# Patient Record
Sex: Female | Born: 1970 | Hispanic: Yes | Marital: Single | Smoking: Never smoker
Health system: Southern US, Community
[De-identification: ages and names within clinical notes are randomized; demographics above are authoritative.]

## PROBLEM LIST (undated history)

## (undated) DIAGNOSIS — I10 Essential (primary) hypertension: Secondary | ICD-10-CM

---

## 2006-05-06 ENCOUNTER — Emergency Department: Payer: Self-pay | Admitting: Emergency Medicine

## 2006-05-20 ENCOUNTER — Emergency Department: Payer: Self-pay | Admitting: Emergency Medicine

## 2006-05-21 ENCOUNTER — Ambulatory Visit: Payer: Self-pay | Admitting: Emergency Medicine

## 2006-09-17 ENCOUNTER — Ambulatory Visit: Payer: Self-pay

## 2006-10-24 ENCOUNTER — Ambulatory Visit: Payer: Self-pay

## 2006-11-11 ENCOUNTER — Inpatient Hospital Stay: Payer: Self-pay | Admitting: Unknown Physician Specialty

## 2006-11-15 ENCOUNTER — Ambulatory Visit: Payer: Self-pay

## 2007-06-12 ENCOUNTER — Inpatient Hospital Stay: Payer: Self-pay | Admitting: General Surgery

## 2008-05-31 ENCOUNTER — Emergency Department: Payer: Self-pay | Admitting: Emergency Medicine

## 2009-06-25 ENCOUNTER — Ambulatory Visit: Payer: Self-pay | Admitting: Family Medicine

## 2009-10-05 ENCOUNTER — Inpatient Hospital Stay: Payer: Self-pay | Admitting: Internal Medicine

## 2012-05-19 ENCOUNTER — Emergency Department: Payer: Self-pay | Admitting: Emergency Medicine

## 2012-07-03 ENCOUNTER — Emergency Department: Payer: Self-pay | Admitting: Emergency Medicine

## 2012-07-03 LAB — URINALYSIS, COMPLETE
Bilirubin,UR: NEGATIVE
Glucose,UR: NEGATIVE mg/dL (ref 0–75)
Leukocyte Esterase: NEGATIVE
Ph: 5 (ref 4.5–8.0)
RBC,UR: 3 /HPF (ref 0–5)
Squamous Epithelial: 1

## 2012-07-03 LAB — CBC
HCT: 40.9 % (ref 35.0–47.0)
MCH: 30.6 pg (ref 26.0–34.0)
Platelet: 286 10*3/uL (ref 150–440)
RBC: 4.43 10*6/uL (ref 3.80–5.20)
RDW: 13.3 % (ref 11.5–14.5)
WBC: 11.5 10*3/uL — ABNORMAL HIGH (ref 3.6–11.0)

## 2012-07-03 LAB — COMPREHENSIVE METABOLIC PANEL
Albumin: 4.3 g/dL (ref 3.4–5.0)
Alkaline Phosphatase: 89 U/L (ref 50–136)
Bilirubin,Total: 0.8 mg/dL (ref 0.2–1.0)
Calcium, Total: 8.6 mg/dL (ref 8.5–10.1)
Chloride: 110 mmol/L — ABNORMAL HIGH (ref 98–107)
Creatinine: 0.42 mg/dL — ABNORMAL LOW (ref 0.60–1.30)
Glucose: 101 mg/dL — ABNORMAL HIGH (ref 65–99)
Potassium: 3.3 mmol/L — ABNORMAL LOW (ref 3.5–5.1)
SGOT(AST): 26 U/L (ref 15–37)
SGPT (ALT): 27 U/L (ref 12–78)
Sodium: 140 mmol/L (ref 136–145)

## 2012-07-03 LAB — LIPASE, BLOOD: Lipase: 74 U/L (ref 73–393)

## 2012-07-03 LAB — PREGNANCY, URINE: Pregnancy Test, Urine: NEGATIVE m[IU]/mL

## 2013-03-06 ENCOUNTER — Inpatient Hospital Stay: Payer: Self-pay | Admitting: Surgery

## 2013-03-06 LAB — COMPREHENSIVE METABOLIC PANEL
Albumin: 3.9 g/dL (ref 3.4–5.0)
Anion Gap: 5 — ABNORMAL LOW (ref 7–16)
BUN: 10 mg/dL (ref 7–18)
Bilirubin,Total: 0.4 mg/dL (ref 0.2–1.0)
Calcium, Total: 9.3 mg/dL (ref 8.5–10.1)
Chloride: 102 mmol/L (ref 98–107)
Co2: 28 mmol/L (ref 21–32)
Creatinine: 0.59 mg/dL — ABNORMAL LOW (ref 0.60–1.30)
Osmolality: 271 (ref 275–301)
SGPT (ALT): 37 U/L (ref 12–78)
Sodium: 135 mmol/L — ABNORMAL LOW (ref 136–145)

## 2013-03-06 LAB — URINALYSIS, COMPLETE
Bilirubin,UR: NEGATIVE
Glucose,UR: NEGATIVE mg/dL (ref 0–75)
Ph: 6 (ref 4.5–8.0)
Protein: NEGATIVE
RBC,UR: 2 /HPF (ref 0–5)
Specific Gravity: 1.004 (ref 1.003–1.030)

## 2013-03-06 LAB — LIPASE, BLOOD: Lipase: 74 U/L (ref 73–393)

## 2013-03-06 LAB — CBC
HCT: 40.5 % (ref 35.0–47.0)
HGB: 14.2 g/dL (ref 12.0–16.0)
MCV: 91 fL (ref 80–100)
RBC: 4.44 10*6/uL (ref 3.80–5.20)

## 2013-03-07 LAB — CBC WITH DIFFERENTIAL/PLATELET
Basophil #: 0.1 10*3/uL (ref 0.0–0.1)
Eosinophil %: 8.4 %
HGB: 13.3 g/dL (ref 12.0–16.0)
Lymphocyte %: 33.5 %
MCH: 31.6 pg (ref 26.0–34.0)
MCHC: 34.3 g/dL (ref 32.0–36.0)
MCV: 92 fL (ref 80–100)
Monocyte %: 11.2 %
Neutrophil #: 4 10*3/uL (ref 1.4–6.5)
Neutrophil %: 46 %
Platelet: 289 10*3/uL (ref 150–440)
WBC: 8.7 10*3/uL (ref 3.6–11.0)

## 2013-03-07 LAB — COMPREHENSIVE METABOLIC PANEL
Alkaline Phosphatase: 105 U/L (ref 50–136)
Anion Gap: 2 — ABNORMAL LOW (ref 7–16)
Calcium, Total: 9.1 mg/dL (ref 8.5–10.1)
Co2: 31 mmol/L (ref 21–32)
Creatinine: 0.54 mg/dL — ABNORMAL LOW (ref 0.60–1.30)
EGFR (African American): >60
EGFR (Non-African Amer.): >60
Glucose: 107 mg/dL — ABNORMAL HIGH (ref 65–99)
Osmolality: 275 (ref 275–301)
Potassium: 3.9 mmol/L (ref 3.5–5.1)
SGOT(AST): 20 U/L (ref 15–37)
SGPT (ALT): 29 U/L (ref 12–78)
Sodium: 138 mmol/L (ref 136–145)
Total Protein: 8.3 g/dL — ABNORMAL HIGH (ref 6.4–8.2)

## 2013-03-08 LAB — COMPREHENSIVE METABOLIC PANEL
Albumin: 3 g/dL — ABNORMAL LOW (ref 3.4–5.0)
Alkaline Phosphatase: 202 U/L — ABNORMAL HIGH (ref 50–136)
Bilirubin,Total: 0.4 mg/dL (ref 0.2–1.0)
Calcium, Total: 8.8 mg/dL (ref 8.5–10.1)
Chloride: 105 mmol/L (ref 98–107)
Creatinine: 0.43 mg/dL — ABNORMAL LOW (ref 0.60–1.30)
EGFR (Non-African Amer.): >60
Glucose: 118 mg/dL — ABNORMAL HIGH (ref 65–99)
Potassium: 3.8 mmol/L (ref 3.5–5.1)
SGOT(AST): 64 U/L — ABNORMAL HIGH (ref 15–37)
SGPT (ALT): 64 U/L (ref 12–78)

## 2013-03-08 LAB — CBC WITH DIFFERENTIAL/PLATELET
Basophil #: 0 10*3/uL (ref 0.0–0.1)
Basophil %: 0.1 %
Eosinophil #: 0 10*3/uL (ref 0.0–0.7)
HCT: 34.8 % — ABNORMAL LOW (ref 35.0–47.0)
MCH: 31.6 pg (ref 26.0–34.0)
MCV: 92 fL (ref 80–100)
Neutrophil #: 10.5 10*3/uL — ABNORMAL HIGH (ref 1.4–6.5)
Platelet: 290 10*3/uL (ref 150–440)

## 2013-03-11 LAB — PATHOLOGY REPORT

## 2013-06-30 ENCOUNTER — Ambulatory Visit: Payer: Self-pay

## 2014-04-13 ENCOUNTER — Emergency Department: Payer: Self-pay | Admitting: Emergency Medicine

## 2014-04-13 LAB — URINALYSIS, COMPLETE
BILIRUBIN, UR: NEGATIVE
Bacteria: NONE SEEN
Glucose,UR: NEGATIVE mg/dL (ref 0–75)
KETONE: NEGATIVE
Nitrite: NEGATIVE
Ph: 6 (ref 4.5–8.0)
Protein: NEGATIVE
RBC,UR: 3 /HPF (ref 0–5)
Specific Gravity: 1.011 (ref 1.003–1.030)
Squamous Epithelial: 3
WBC UR: 33 /HPF (ref 0–5)

## 2014-08-10 ENCOUNTER — Ambulatory Visit: Payer: Self-pay

## 2014-08-14 ENCOUNTER — Ambulatory Visit: Payer: Self-pay

## 2014-09-10 ENCOUNTER — Emergency Department: Payer: Self-pay | Admitting: Emergency Medicine

## 2014-09-28 ENCOUNTER — Emergency Department: Payer: Self-pay | Admitting: Emergency Medicine

## 2014-11-06 NOTE — H&P (Signed)
PATIENT NAME:  Terry ChenLAGUNES HERNANDEZ, Asusena MR#:  [id number] DATE OF BIRTH:  1971-04-01  SURGICAL HISTORY AND PHYSICAL  CHIEF COMPLAINT: Right upper quadrant pain.   HISTORY OF PRESENT ILLNESS: This is a patient with right upper quadrant pain. Unclear if she has had episodes before, but clearly from reviewing the chart, she has had visits to the Emergency Room with work-up in the past not showing gallstones. Currently, she does show gallstones on ultrasound and I was asked to see the patient for probable acute cholecystitis.   The patient describes nausea, vomiting, three days of abdominal pain centered in the epigastrium and right upper quadrant radiating to her right shoulder. She may have even had some fevers. I am not exactly sure. Denies jaundice and has had normal bowel movements. No blood.   PAST MEDICAL HISTORY: Hypertension.   PAST SURGICAL HISTORY: C-section.   ALLERGIES: None.   MEDICATIONS: Amlodipine. Others noted in chart.   FAMILY HISTORY: Noncontributory.   SOCIAL HISTORY: Does not smoke or drink.   REVIEW OF SYSTEMS: A 10-system review is performed and negative with the exception of that mentioned in the HPI.   PHYSICAL EXAMINATION:  GENERAL: Healthy, uncomfortable-appearing Hispanic female patient.  VITAL SIGNS: Temperature 99, pulse 81, respirations 20, blood pressure 119/79, pain scale 8, 98% room air saturation.  HEENT: No scleral icterus.  NECK: No palpable neck nodes.  CHEST: Clear to auscultation.  CARDIAC: Regular rate and rhythm.  ABDOMEN: Soft. Tenderness in the right upper quadrant. A midline scar is present infraumbilically without hernia. Tender in the right upper quadrant with a positive Murphy sign.  EXTREMITIES: Without edema. Calves are nontender.  NEUROLOGIC: Grossly intact.  INTEGUMENT: No jaundice.   DIAGNOSTIC STUDIES:  Ultrasound shows possible small stones or sludge.   Blood 2+ is noted and leukocyte esterase is positive on UA.   Sodium is  135. Potassium 3.4, creatinine of 0.59.   White blood cell count of 13.6 and H and H of 14 and 40.   ASSESSMENT AND PLAN: This is a patient with likely acute cholecystitis. She has had multiple episodes like this, so it is documented in the chart. I will obtain a consent via an interpreter once the interpreter becomes available, but my plan would be to admit the patient to the hospital, start IV antibiotics, and hydrate her and plan laparoscopic cholecystectomy once she is hydrated. The rationale for this was discussed with her. She voiced understanding, but I will obtain a proper interpreter evaluation later.    ____________________________ Adah Salvageichard E. Excell Seltzerooper, MD rec:np D: 03/06/2013 19:43:54 ET T: 03/06/2013 21:10:17 ET JOB#: [id number]  cc: Adah Salvageichard E. Excell Seltzerooper, MD, <Dictator> Lattie HawICHARD E COOPER MD ELECTRONICALLY SIGNED 03/07/2013 7:44

## 2014-11-06 NOTE — Discharge Summary (Signed)
PATIENT NAME:  Terry [name], Terry [name] MR#:  [id number] DATE OF BIRTH:  1970/09/27  DATE OF ADMISSION:  03/06/2013 DATE OF DISCHARGE:  03/09/2013  DIAGNOSES:  Hypertension, acute cholecystitis.   PROCEDURES: Laparoscopic cholecystectomy.   HISTORY OF PRESENT ILLNESS AND HOSPITAL COURSE: This is a patient with a diagnosis of acute cholecystitis seen in the Emergency Room. She was taken to the Operating Room where acute cholecystitis was confirmed. She made an uncomplicated postoperative recovery, was tolerating a regular diet and discharged in stable condition to follow up in our office in 10 days. She is given and Percocet for pain and asked to follow up in our office in 10 days and to restart all of her regular medications for hypertension. See medical reconciliation sheet. She is given wound care instructions about showering and will see us next week.   ____________________________ Adah Salvageichard E. Excell Seltzerooper, MD rec:cs D: 03/09/2013 10:48:30 ET T: 03/09/2013 18:48:47 ET JOB#: [id number]  cc: Adah Salvageichard E. Excell Seltzerooper, MD, <Dictator> Lattie HawICHARD E Shirlie Enck MD ELECTRONICALLY SIGNED 03/13/2013 9:34

## 2014-11-06 NOTE — Op Note (Signed)
PATIENT NAME:  Terry [name], Terry [name] MR#:  [id number] DATE OF BIRTH:  October 18, 1970  DATE OF PROCEDURE:  03/07/2013  PREOPERATIVE DIAGNOSIS: Acute cholecystitis.   POSTOPERATIVE DIAGNOSIS: Acute cholecystitis.   PROCEDURE: Laparoscopic cholecystectomy.   SURGEON: Dionne Miloichard Nessie Nong, MD   ANESTHESIA: General with endotracheal tube.   INDICATIONS: This is a patient with unrelenting right upper quadrant pain associated with fatty food intolerance and work-up showing probable acute cholecystitis with positive Murphy's sign and ultrasound showing stones and sludge. Preoperatively, we discussed rationale for surgery, the options of observation, risk of bleeding, infection, recurrence of symptoms, failure to resolve her symptoms, open procedure, bile duct damage, bile duct leak, bowel injury and retained stone any of which could require further surgery and/or ERCP, stent and papillotomy. This was reviewed for her with the aid of an interpreter this morning. She understood and agreed to proceed.   FINDINGS: Somewhat edematous gallbladder with scar tissue on the anterior surface of the gallbladder wall. Thick bile.   DESCRIPTION OF PROCEDURE: The patient was induced to general anesthesia, given IV antibiotics. VTE prophylaxis was in place. She was prepped and draped in a sterile fashion. Marcaine was infiltrated in skin and subcutaneous tissues around the supraumbilical area. Incision was made. Veress needle was placed, pneumoperitoneum was obtained and a 5 mm trocar port was placed. The abdominal cavity was explored and under direct vision a 10 mm epigastric port and 2 lateral 5 mm ports were placed. The gallbladder was placed on tension. The peritoneum over the infundibulum was incised bluntly. The cystic duct gallbladder junction was well identified. The cystic artery was doubly clipped and divided in 2 branches. This allowed for good visualization of the cystic duct as it entered the infundibulum. Here it  was doubly clipped and divided, and the gallbladder was taken from the gallbladder fossa with electrocautery and passed out through the epigastric port site with the aid of an Endo Catch bag. The area was checked for hemostasis and was found to be adequate. There was no sign of bleeding or bile leak. The camera was placed in the epigastric site to view back at the periumbilical site. There were adhesions in the pelvis, but no sign of bowel injury. The trocar was well away from these adhesions. Again, no sign of bleeding, bile leak or bowel injury. Therefore, pneumoperitoneum was released. All ports were removed. Fascial edges at the epigastric site were approximated with 0 Vicryl figure-of-eight sutures. 4-0 subcuticular Monocryl was used at all skin edges. Steri-Strips, Mastisol and sterile dressings were placed.   The patient tolerated the procedure well. There were no complications. She was taken to the recovery room in stable condition to be admitted for continued care. ____________________________ Adah Salvageichard E. Excell Seltzerooper, MD rec:sb D: 03/07/2013 11:40:07 ET T: 03/07/2013 13:11:09 ET JOB#: [id number]  cc: Adah Salvageichard E. Excell Seltzerooper, MD, <Dictator> Lattie HawICHARD E Glynnis Gavel MD ELECTRONICALLY SIGNED 03/07/2013 13:53

## 2014-11-06 NOTE — H&P (Signed)
Subjective/Chief Complaint RUQ pain   History of Present Illness 3 days RUQ pain, nausea, vomiting fevers? no prior eopisode no jaundice nml BM   Past History PMH HTN PSH c section   Past Medical Health Hypertension   Past Med/Surgical Hx:  Migraines:   C-section:   ALLERGIES:  No Known Allergies:   Family and Social History:  Family History Non-Contributory   Social History negative tobacco, negative ETOH   Place of Living Home   Review of Systems:  Fever/Chills No   Cough No   Abdominal Pain Yes   Diarrhea No   Constipation No   Nausea/Vomiting Yes   SOB/DOE No   Chest Pain No   Dysuria No   Tolerating Diet No  Nauseated  Vomiting   Medications/Allergies Reviewed Medications/Allergies reviewed   Physical Exam:  GEN uncomfortable   HEENT pink conjunctivae   NECK supple   RESP normal resp effort  clear BS   CARD regular rate   ABD positive tenderness  soft  pos Murphy's sign   LYMPH negative neck   EXTR negative edema   SKIN normal to palpation   PSYCH alert, A+O to time, place, person   Lab Results: Hepatic:  21-Aug-14 14:32   Bilirubin, Total 0.4  Alkaline Phosphatase 130  SGPT (ALT) 37  SGOT (AST) 25  Total Protein, Serum  9.2  Albumin, Serum 3.9  Routine Chem:  21-Aug-14 14:32   Glucose, Serum  123  BUN 10  Creatinine (comp)  0.59  Sodium, Serum  135  Potassium, Serum  3.4  Chloride, Serum 102  CO2, Serum 28  Calcium (Total), Serum 9.3  Osmolality (calc) 271  eGFR (African American) >60  eGFR (Non-African American) >60 (eGFR values <14m/min/1.73 m2 may be an indication of chronic kidney disease (CKD). Calculated eGFR is useful in patients with stable renal function. The eGFR calculation will not be reliable in acutely ill patients when serum creatinine is changing rapidly. It is not useful in  patients on dialysis. The eGFR calculation may not be applicable to patients at the low and high extremes of body sizes,  pregnant women, and vegetarians.)  Anion Gap  5  Lipase 74 (Result(s) reported on 06 Mar 2013 at 03:06PM.)  Routine UA:  21-Aug-14 14:32   Color (UA) Straw  Clarity (UA) Clear  Glucose (UA) Negative  Bilirubin (UA) Negative  Ketones (UA) Negative  Specific Gravity (UA) 1.004  Blood (UA) 2+  pH (UA) 6.0  Protein (UA) Negative  Nitrite (UA) Negative  Leukocyte Esterase (UA) 1+ (Result(s) reported on 06 Mar 2013 at 03:02PM.)  RBC (UA) 2 /HPF  WBC (UA) 8 /HPF  Bacteria (UA) TRACE  Epithelial Cells (UA) 1 /HPF (Result(s) reported on 06 Mar 2013 at 03:02PM.)  Routine Hem:  21-Aug-14 14:32   WBC (CBC)  13.6  RBC (CBC) 4.44  Hemoglobin (CBC) 14.2  Hematocrit (CBC) 40.5  Platelet Count (CBC) 302 (Result(s) reported on 06 Mar 2013 at 02:58PM.)  MCV 91  MCH 31.9  MCHC 35.0  RDW 13.8   Radiology Results: [place]    21-Aug-14 16:56, [place]  [place]  REASON FOR EXAM:    RUQ pain, n/v and right shoulder pain  COMMENTS:   Body Site: GB and Fossa, CBD, Head of Pancreas    PROCEDURE: [place] - [place] SURVE[place]  - Mar 06 2013  4:56PM     RESULT: Limited right upper quadrant abdominal ultrasound is performed.  The patient has a previous abdominal ultrasound dated 07/03/2012.    The pancreas is not visualized because of overlying bowel gas. The liver   size is 13.95 cm. The hepatic echotexture appears to be grossly normal.   No intrahepatic biliary ductal dilation is evident. The portal venous   flow is normal. The gallbladder wall thickness is 1.3 mm. A negative   sonographic Murphy's sign is present. There are several small echogenic   nonshadowing mobile structures within the gallbladder which could   represent very tiny stones. Sludge is a consideration.  IMPRESSION:  Possible sludge or tiny stones. No evidence of acute   cholecystitis. Nonvisualization of the pancreas.    Dictation Site: 2        Verified By: Sundra Aland, M.D.,  MD    Assessment/Admission Diagnosis acute chole cystitis rec admit hydrate lap chole later will get interpretor for consent   Electronic Signatures: Florene Glen (MD)  (Signed 21-Aug-14 19:40)  Authored: CHIEF COMPLAINT and HISTORY, PAST MEDICAL/SURGIAL HISTORY, ALLERGIES, FAMILY AND SOCIAL HISTORY, REVIEW OF SYSTEMS, PHYSICAL EXAM, LABS, Radiology, ASSESSMENT AND PLAN   Last Updated: 21-Aug-14 19:40 by Florene Glen (MD)

## 2015-01-30 ENCOUNTER — Encounter: Payer: Self-pay | Admitting: Emergency Medicine

## 2015-01-30 ENCOUNTER — Emergency Department
Admission: EM | Admit: 2015-01-30 | Discharge: 2015-01-30 | Disposition: A | Discharge status: Home / Self Care | Payer: Self-pay | Attending: Emergency Medicine | Admitting: Emergency Medicine

## 2015-01-30 ENCOUNTER — Other Ambulatory Visit: Payer: Self-pay

## 2015-01-30 ENCOUNTER — Emergency Department: Payer: Self-pay

## 2015-01-30 DIAGNOSIS — R079 Chest pain, unspecified: Secondary | ICD-10-CM | POA: Insufficient documentation

## 2015-01-30 DIAGNOSIS — E876 Hypokalemia: Secondary | ICD-10-CM | POA: Insufficient documentation

## 2015-01-30 DIAGNOSIS — I10 Essential (primary) hypertension: Secondary | ICD-10-CM | POA: Insufficient documentation

## 2015-01-30 HISTORY — DX: Essential (primary) hypertension: I10

## 2015-01-30 LAB — COMPREHENSIVE METABOLIC PANEL
ALBUMIN: 4.2 g/dL (ref 3.5–5.0)
ALT: 15 U/L (ref 14–54)
ANION GAP: 9 (ref 5–15)
AST: 29 U/L (ref 15–41)
Alkaline Phosphatase: 67 U/L (ref 38–126)
BUN: 10 mg/dL (ref 6–20)
CO2: 22 mmol/L (ref 22–32)
Calcium: 8.8 mg/dL — ABNORMAL LOW (ref 8.9–10.3)
Chloride: 106 mmol/L (ref 101–111)
Creatinine, Ser: 0.7 mg/dL (ref 0.44–1.00)
GFR calc Af Amer: >60 mL/min (ref >60)
GFR calc non Af Amer: >60 mL/min (ref >60)
GLUCOSE: 180 mg/dL — AB (ref 65–99)
POTASSIUM: 2.8 mmol/L — AB (ref 3.5–5.1)
SODIUM: 137 mmol/L (ref 135–145)
TOTAL PROTEIN: 7.7 g/dL (ref 6.5–8.1)
Total Bilirubin: 0.7 mg/dL (ref 0.3–1.2)

## 2015-01-30 LAB — CBC
HEMATOCRIT: 35.3 % (ref 35.0–47.0)
HEMOGLOBIN: 11.6 g/dL — AB (ref 12.0–16.0)
MCH: 27.2 pg (ref 26.0–34.0)
MCHC: 32.9 g/dL (ref 32.0–36.0)
MCV: 82.6 fL (ref 80.0–100.0)
Platelets: 273 10*3/uL (ref 150–440)
RBC: 4.27 MIL/uL (ref 3.80–5.20)
RDW: 18.6 % — AB (ref 11.5–14.5)
WBC: 7.6 10*3/uL (ref 3.6–11.0)

## 2015-01-30 LAB — TROPONIN I: Troponin I: 0.03 ng/mL (ref <0.031)

## 2015-01-30 MED ORDER — POTASSIUM CHLORIDE 20 MEQ PO PACK
40.0000 meq | PACK | Freq: Once | ORAL | Status: AC
  Administered 2015-01-30: 40 meq via ORAL
  Filled 2015-01-30: qty 2

## 2015-01-30 MED ORDER — ACETAMINOPHEN 325 MG PO TABS
ORAL_TABLET | ORAL | Status: AC
  Administered 2015-01-30: 650 mg via ORAL
  Filled 2015-01-30: qty 2

## 2015-01-30 MED ORDER — ACETAMINOPHEN 325 MG PO TABS
650.0000 mg | ORAL_TABLET | Freq: Once | ORAL | Status: AC
  Administered 2015-01-30: 650 mg via ORAL

## 2015-01-30 NOTE — ED Provider Notes (Signed)
[facility] Emergency Department Provider Note  ____________________________________________  Time seen: 3:00 a.m.  I have reviewed the triage vital signs and the nursing notes.   HISTORY  Chief Complaint Chest Pain      HPI Terry [name] is a 44 y.o. female presents with central chest chest pain 5 hours. Patient denies any dyspnea no nausea no vomiting. Patient stated that both of her hands and around her mouth became numb while at work tonight. Patient admits to this breathing heavily and fast at the time of numbness secondary to her chest pain.She denies any leg pain or swelling     Past Medical History  Diagnosis Date  . Hypertension     There are no active problems to display for this patient.   No past surgical history on file.  No current outpatient prescriptions on file.  Allergies Review of patient's allergies indicates no known allergies.  No family history on file.  Social History History  Substance Use Topics  . Smoking status: Never Smoker   . Smokeless tobacco: Not on file  . Alcohol Use: Not on file    Review of Systems  Constitutional: Negative for fever. Eyes: Negative for visual changes. ENT: Negative for sore throat. Cardiovascular: Positive for chest pain. Respiratory: Negative for shortness of breath. Gastrointestinal: Negative for abdominal pain, vomiting and diarrhea. Genitourinary: Negative for dysuria. Musculoskeletal: Negative for back pain. Skin: Negative for rash. Neurological: Negative for headaches, focal weakness or numbness.   10-point ROS otherwise negative.  ____________________________________________   PHYSICAL EXAM:  VITAL SIGNS: ED Triage Vitals  Enc Vitals Group     BP 01/30/15 0223 140/90 mmHg     Pulse Rate 01/30/15 0223 93     Resp 01/30/15 0223 18     Temp 01/30/15 0223 98.6 F (37 C)     Temp src --      SpO2 01/30/15 0223 99 %     Weight --      Height --       Head Cir --      Peak Flow --      Pain Score 01/30/15 0224 10     Pain Loc --      Pain Edu? --      Excl. in GC? --      Constitutional: Alert and oriented. Well appearing and in no distress. Eyes: Conjunctivae are normal. PERRL. Normal extraocular movements. ENT   Head: Normocephalic and atraumatic.   Nose: No congestion/rhinnorhea.   Mouth/Throat: Mucous membranes are moist.   Neck: No stridor. Cardiovascular: Normal rate, regular rhythm. Normal and symmetric distal pulses are present in all extremities. No murmurs, rubs, or gallops. Respiratory: Normal respiratory effort without tachypnea nor retractions. Breath sounds are clear and equal bilaterally. No wheezes/rales/rhonchi. Gastrointestinal: Soft and nontender. No distention. There is no CVA tenderness. Genitourinary: deferred Musculoskeletal: Nontender with normal range of motion in all extremities. No joint effusions.  No lower extremity tenderness nor edema. Neurologic:  Normal speech and language. No gross focal neurologic deficits are appreciated. Speech is normal.  Skin:  Skin is warm, dry and intact. No rash noted. Psychiatric: Mood and affect are normal. Speech and behavior are normal. Patient exhibits appropriate insight and judgment.  ____________________________________________    LABS (pertinent positives/negatives)  Labs Reviewed  CBC - Abnormal; Notable for the following:    Hemoglobin 11.6 (*)    RDW 18.6 (*)    All other components within normal limits  COMPREHENSIVE METABOLIC PANEL -  Abnormal; Notable for the following:    Potassium 2.8 (*)    Glucose, Bld 180 (*)    Calcium 8.8 (*)    All other components within normal limits  TROPONIN I  TROPONIN I     ____________________________________________   EKG  ED ECG REPORT I, Shaleen Talamantez, [place], the attending physician, personally viewed and interpreted this ECG.   Date: 01/30/2015  EKG Time: 2:14 AM  Rate: 86  Rhythm:  Normal sinus rhythm  Axis: Normal  Intervals: Normal  ST&T Change: None   ____________________________________________    RADIOLOGY  Imaging Results       DG Chest Portable 1 View (Final result) Result time: 01/30/15 06:41:48   Final result by Rad Results In Interface (01/30/15 06:41:48)   Narrative:   CLINICAL DATA: Chest pain and headache since early morning. Nonsmoker. Hypertension.  EXAM: PORTABLE CHEST - 1 VIEW  COMPARISON: 09/28/2014  FINDINGS: The heart size and mediastinal contours are within normal limits. Both lungs are clear. The visualized skeletal structures are unremarkable.  IMPRESSION: No active disease.   Electronically Signed By: Burman NievesWilliam Stevens M.D. On: 01/30/2015 06:41        INITIAL IMPRESSION / ASSESSMENT AND PLAN / ED COURSE  Pertinent labs & imaging results that were available during my care of the patient were reviewed by me and considered in my medical decision making (see chart for details).  Patient received potassium chloride 40 mEq by mouth. Consider cardiac etiology for the patient's pain as such cardiac enzymes were performed 2 both of which were negative. Patient's EKG revealed no evidence of infarct or ischemia. PERC 0 ____________________________________________   FINAL CLINICAL IMPRESSION(S) / ED DIAGNOSES  Final diagnoses:  Chest pain, unspecified chest pain type  Hypokalemia      Darci Currentandolph N Doreene Forrey, MD 02/02/15 [phone]

## 2015-01-30 NOTE — ED Notes (Addendum)
Pt reports Chest pain denies vomiting, endorses nausea. Pt reports pain x 5 hours. Pt also reports headache starting at the same time.Pt also reports numbness in right arm, and then left arm. Then pt reports having trouble opening her hands.

## 2015-01-30 NOTE — Discharge Instructions (Signed)
Dolor de pecho (no especfico) (Chest Pain (Nonspecific)) Con frecuencia es difcil dar un diagnstico especfico de la causa del dolor de [place]. Siempre hay una posibilidad de que el dolor podra estar relacionado con algo grave, como un ataque al corazn o un cogulo sanguneo en los pulmones. Debe someterse a controles con el mdico para ms evaluaciones. CAUSAS   Acidez.  Neumona o bronquitis.  Ansiedad o estrs.  Inflamacin de la zona que rodea al corazn (pericarditis) o a los pulmones (pleuritis o pleuresa).  Un cogulo sanguneo en el pulmn.  Colapso de un pulmn (neumotrax), que puede aparecer de [employer] repentina por s solo (neumotrax espontneo) o debido a un traumatismo en el trax.  Culebrilla (virus del herpes zster). La pared torcica est compuesta por huesos, msculos y Database administrator. Cualquiera de estos puede ser la fuente del dolor.  Puede haber una contusin en los huesos debido a una lesin.  Puede haber un esguince en los msculos o el cartlago ocasionado por la tos o por [place].  El cartlago puede verse afectado por una inflamacin y Engineer, production (costocondritis). DIAGNSTICO  Ileene Hutchinson se necesiten anlisis de laboratorio u otros estudios para Animator causa del Social research officer, government. Adems, puede indicarle que se haga una prueba llamada electrocadiograma (ECG) ambulatorio. El ECG registra los patrones de los latidos cardacos durante 24horas. Adems, pueden hacerle otros estudios, por ejemplo:  Ecocardiograma transtorcico (ETT). Durante IT trainer, se usan ondas sonoras para evaluar el flujo de la sangre a travs del corazn.  Ecocardiograma transesofgico (ETE).  Monitoreo cardaco. Permite que el mdico controle la frecuencia y el ritmo cardaco en tiempo real.  Monitor Holter. Es un dispositivo porttil que [employer]'s latidos cardacos y [place] a Retail buyer las arritmias cardacas. Le permite al [employer] registrar la actividad [place], si es necesario.  Pruebas de estrs por ejercicio o por medicamentos que aceleran los latidos cardacos. TRATAMIENTO   El tratamiento depende de la causa del dolor de [place]. El tratamiento puede incluir:  Inhibidores de la acidez estomacal.  Antiinflamatorios.  Analgsicos para las enfermedades inflamatorias.  Antibiticos, si hay una infeccin.  Podrn aconsejarle que modifique su estilo de vida. Esto incluye dejar de fumar y evitar el alcohol, la cafena y el chocolate.  Pueden aconsejarle que mantenga la cabeza levantada (elevada) cuando duerme. Esto reduce la probabilidad de que el cido retroceda del estmago al esfago. En la [employer], el dolor de pecho no especfico mejorar en el trmino de 2 a 3das, con reposo y [employer].  INSTRUCCIONES PARA EL CUIDADO EN EL HOGAR   Si le prescriben antibiticos, tmelos tal como se le indic. Termnelos aunque comience a sentirse mejor.  [address] no haga actividades fsicas que provoquen dolor de [place]. Contine con las actividades fsicas tal como se le indic  No consuma ningn producto que contenga tabaco, incluidos cigarrillos, tabaco de Higher education careers adviser o cigarrillos electrnicos.  Evite el consumo de alcohol.  Tome los medicamentos solamente como se lo haya indicado el mdico.  Siga las sugerencias del mdico en lo que respecta a las pruebas adicionales, si el dolor de pecho no desaparece.  Concurra a todas las visitas de control programadas. Si no lo hace, podra desarrollar problemas permanentes (crnicos) relacionados con el dolor. Si hay algn problema para concurrir a una cita, llame para reprogramarla. SOLICITE ATENCIN MDICA SI:   El dolor de pecho no desaparece, incluso despus del tratamiento.  Tiene una erupcin cutnea con ampollas en el  pecho.  Lance Mussiene fiebre. SOLICITE ATENCIN MDICA DE Engelhard CorporationNMEDIATO SI:   Aumenta el dolor de pecho o este se irradia hacia el  brazo, el cuello, la [place]mandbula, la espalda o el abdomen.  Le falta el aire.  La tos empeora, o expectora sangre.  Siente dolor intenso en la espalda o el abdomen.  Se siente nauseoso o vomita.  Siente debilidad intensa.  Se desmaya.  Tiene escalofros. Esto es Radio broadcast assistantuna emergencia. No espere a ver si el dolor se pasa. Obtenga ayuda mdica de inmediato. Llame a los servicios de emergencia locales (911 en los [place]). No conduzca por sus propios medios Dollar Generalhasta el hospital. ASEGRESE DE QUE:   Comprende estas instrucciones.  Controlar su afeccin.  Recibir ayuda de inmediato si no mejora o si empeora. Document Released: 07/03/2005 Document Revised: 07/08/2013 [facility] Patient Information 2015 [place], [place]. This information is not intended to replace advice given to you by your health care provider. Make sure you discuss any questions you have with your health care provider.  Hipokalemia ( Hypokalemia) Hipokalemia significa que el nivel de potasio en sangre es menor que lo normal. El potasio es un electrolito que ayuda a regular la cantidad de lquido del organismo. Tambin estimula la contraccin muscular y ayuda a que la funcin muscular sea la [place]adecuada. La Gwendolyn Fillmayora del potasio del organismo se encuentra dentro de las clulas y slo una pequea cantidad en la sangre. Debido a que la cantidad en la sangre es muy pequea, pequeos cambios en la sangre pueden poner en peligro la vida. CAUSAS  Antibiticos.  Diarrea o vmitos.  El uso excesivo de laxantes, lo que puede causar diarrea.  Enfermedad renal crnica.  Uso de diurticos.  Trastornos de Psychologist, sport and exercisela alimentacin (bulimia).  Bajos niveles de magnesio.  Sudoracin abundante. SIGNOS Y SNTOMAS  Debilidad.  Estreimiento.  Fatiga.  Calambres musculares.  Confusin mental.  Latidos cardacos salteados o irregulares (palpitaciones).  Hormigueo o adormecimiento. DIAGNSTICO  El mdico puede diagnosticar hipokalemia  por los anlisis de [place]. Adems para controlar sus niveles de potasio, el mdico podr ordenar otros anlisis de laboratorio. TRATAMIENTO La hipokalemia puede tratarse con suplementos de potasio por va oral o realizando ajustes en sus medicamentos habituales. Si sus niveles de potasio son muy bajos, ser necesario que lo reciba a travs de una vena (IV) y se lo controle en el hospital. Neomia DearUna dieta rica en potasio tambin puede ser de [place]ayuda. Los alimentos ricos en potasio son:  Evalyn CascoFrutos secos, como cacahuetes y pistachos.  Semillas, como semillas de girasol y de [place].  Porotos, guisantes secos y lentejas.  Granos enteros y panes y cereales con salvado.  Nils PyleFrutas y vegetales frescos como damascos, avocado, bananas, meln, kiwi, naranjas, esprragos y patatas.  Jugos de naranja y tomates.  Carnes rojas.  Yogur con frutas. INSTRUCCIONES PARA EL CUIDADO EN EL HOGAR  Tome todos los Estée Laudermedicamentos como le indic el mdico.  Siga una dieta saludable e incluya alimentos nutritivos como frutas, vegetales, nueces, granos enteros y carnes Monroviamagras.  Si est tomando laxantes, asegrese de seguir las instrucciones del envase. SOLICITE ATENCIN MDICA SI:  La debilidad empeora.  Siente que el corazn late fuerte o est acelerado.  Vomita o tiene diarrea.  Tiene problemas para mantener su nivel de glucosa en el rango normal. SOLICITE ATENCIN MDICA DE INMEDIATO SI:  Siente dolor en el pecho, le falta de aire o se siente mareado.  Vomita o tiene diarrea durante ms de 2 [address]  [address].  Se desmaya. ASEGRESE DE QUE:  Comprende estas instrucciones.  Controlar su afeccin.  Recibir ayuda de inmediato si no mejora o si empeora. Document Released: 07/03/2005 Document Revised: 04/23/2013 [facility] Patient Information 2015 [place], [place]. This information is not intended to replace advice given to you by your health care provider. Make sure you discuss any questions you have with your health care  provider.

## 2015-01-30 NOTE — ED Notes (Signed)
Pt informed to return if any life threatening symptoms occur.  

## 2015-03-24 IMAGING — CR DG CHEST 1V PORT
1 series · 1 of 1 positions shown · non-contrast
Comparison: 05/19/2012

CLINICAL DATA: Sudden onset chest pain

EXAM:
PORTABLE CHEST - 1 VIEW

[ap]
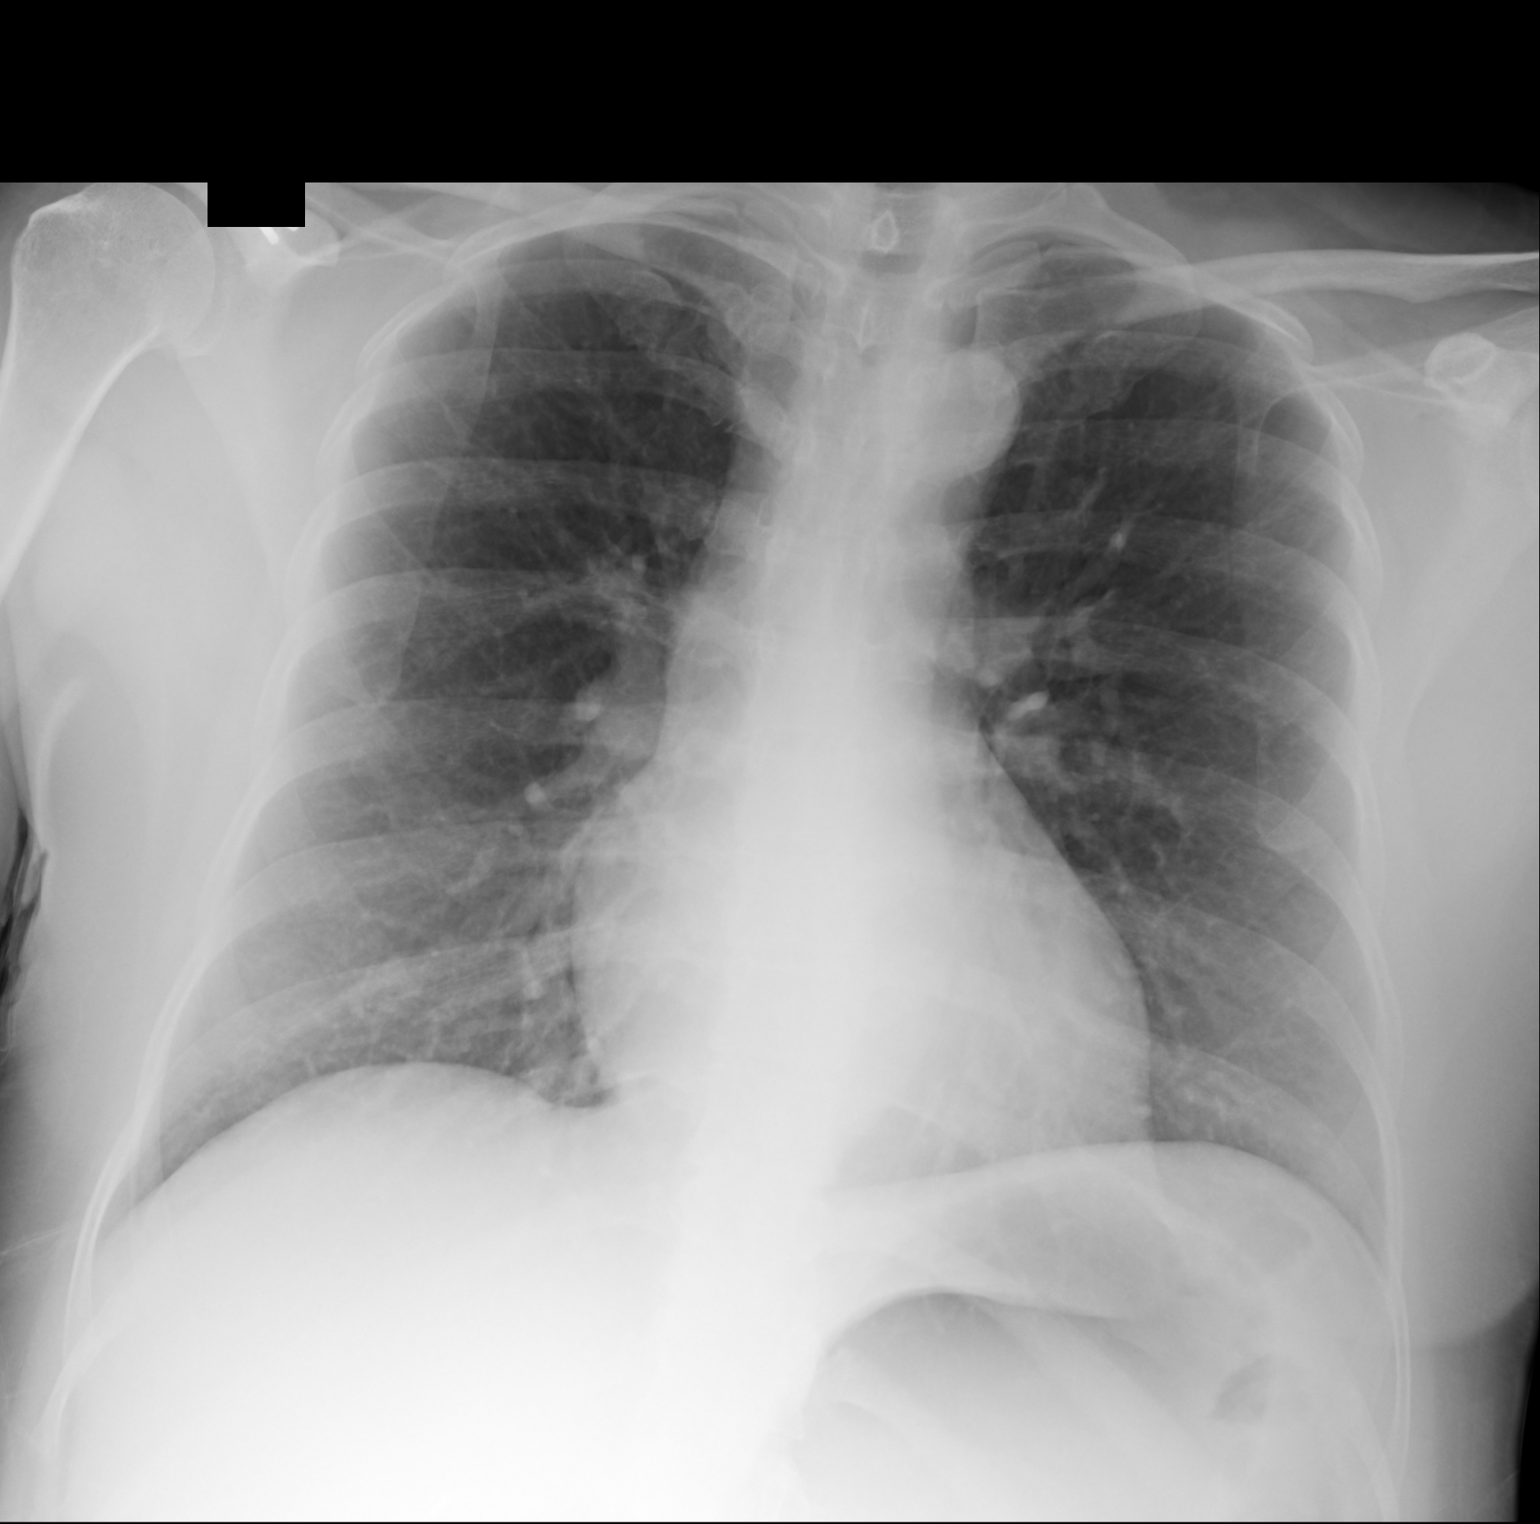

[1 of 1 positions shown; findings below may reference images not displayed]

FINDINGS: A single AP portable view of the chest demonstrates no focal
airspace consolidation or alveolar edema. The lungs are grossly
clear. There is no large effusion or pneumothorax. Cardiac and
mediastinal contours appear unremarkable.
IMPRESSION: No active disease.

## 2016-08-02 ENCOUNTER — Ambulatory Visit: Payer: Self-pay

## 2016-08-09 ENCOUNTER — Ambulatory Visit: Payer: Self-pay

## 2016-08-16 ENCOUNTER — Ambulatory Visit: Payer: Self-pay | Attending: Oncology | Admitting: *Deleted

## 2016-08-16 ENCOUNTER — Encounter: Payer: Self-pay | Admitting: *Deleted

## 2016-08-16 ENCOUNTER — Ambulatory Visit
Admission: RE | Admit: 2016-08-16 | Discharge: 2016-08-16 | Disposition: A | Discharge status: Home / Self Care | Payer: Self-pay | Source: Ambulatory Visit | Attending: Oncology | Admitting: Oncology

## 2016-08-16 VITALS — BP 138/87 | HR 85 | Temp 98.0°F | Ht 61.42 in | Wt 138.3 lb

## 2016-08-16 DIAGNOSIS — Z0000 Encounter for general adult medical examination without abnormal findings: Secondary | ICD-10-CM

## 2016-08-16 NOTE — Progress Notes (Signed)
Subjective:     Patient ID: Terry [name], female   DOB: 1971-05-26, 46 y.o.   MRN: [id number]  HPI   Review of Systems     Objective:   Physical Exam  Pulmonary/Chest: Right breast exhibits no inverted nipple, no mass, no nipple discharge, no skin change and no tenderness. Left breast exhibits no inverted nipple, no mass, no nipple discharge, no skin change and no tenderness. Breasts are symmetrical.  Abdominal: There is no splenomegaly or hepatomegaly.    Genitourinary: No labial fusion. There is no rash, tenderness, lesion or injury on the right labia. There is no rash, tenderness, lesion or injury on the left labia. Cervix exhibits no motion tenderness, no discharge and no friability. Right adnexum displays no mass, no tenderness and no fullness. Left adnexum displays no mass, no tenderness and no fullness. No erythema, tenderness or bleeding in the vagina. No foreign body in the vagina. No signs of injury around the vagina. No vaginal discharge found.         Assessment:     46 year old Hispanic [facility]  Terry [name], the interpreter present during the interview and exam.  Clinical breast exam unremarkable.  Taught self breast awareness.  Patient states [facility]  There is no report available for review.  Will go ahead and collect specimen for pap.  Patient has been screened for eligibility.  She does not have any insurance, Medicare or Medicaid.  She also meets financial eligibility.  Hand-out given on the Affordable Care Act.    Plan:     Screening mammogram ordered.  Specimen for pap sent to the lab.  Will follow-up per [facility] protocol.

## 2016-08-16 NOTE — Patient Instructions (Signed)
Prueba del VPH (HPV Test) La prueba del virus del papiloma humano (VPH) se usa para detectar los tipos de infeccin por el VPH de alto riesgo. El VPH es un grupo de alrededor de 100 virus. Muchos de estos virus causan tumores dentro de los genitales, sobre ellos o a su alrededor. La mayora de los VPH provocan infecciones que suelen desaparecen sin tratamiento. Sin embargo, los tipos 6, 11, 16 y 18 del VPH se consideran de alto riesgo y pueden aumentar el riesgo de padecer cncer de cuello del tero o de ano si la infeccin no se trata. La prueba del VPH identifica las cadenas de ADN (genticas) de la infeccin por el VPH, por lo que tambin se denomina prueba de ADN para el VPH. Aunque el VPH se encuentra tanto en los hombres como en las mujeres, la prueba del VPH se usa solo para detectar un mayor riesgo de cncer en las mujeres:  Con una prueba de Papanicolaou anormal.  Despus del tratamiento de una prueba de Papanicolaou anormal.  Entre 30y 65aos.  Despus del tratamiento de una infeccin por el VPH de alto riesgo. La prueba del VPH se puede hacer al mismo tiempo que un examen plvico y una prueba de Papanicolaou en mujeres de ms de 30 aos. Tanto la prueba del VPH como la prueba de Papanicolaou requieren una muestra de clulas del cuello del tero. PREPARACIN PARA EL ESTUDIO  No se haga lavados vaginales ni se d un bao durante 24 a 48 horas antes de la prueba o como se lo haya indicado el mdico.  No tenga sexo durante 24 a 48 horas antes de la prueba o como se lo haya indicado el mdico.  Es posible que se le pida que reprograme la prueba si est menstruando.  Se le pedir que orine antes de la prueba. RESULTADOS Es su responsabilidad retirar el resultado del estudio. Consulte en el laboratorio o en el departamento en el que fue realizado el estudio cundo y cmo podr obtener los resultados. Hable con el mdico si tiene alguna pregunta sobre los resultados. El resultado ser  negativo o positivo. Significado de los resultados negativos de la prueba Un resultado negativo de la prueba del VPH significa que no se detect el VPH, y es muy probable que no tenga el virus. Significado de los resultados positivos de la prueba Un resultado positivo de la prueba del VPH indica que tiene el virus.  Si el resultado de la prueba muestra la presencia de alguna cadena del VPH de alto riesgo, puede tener mayor riesgo de padecer cncer de cuello del tero o de ano si la infeccin no se trata.  Si se encuentran cadenas del VPH de bajo riesgo, es poco probable que tenga un alto riesgo de padecer cncer. Hable con el mdico sobre los resultados. El mdico utilizar los resultados para realizar un diagnstico y determinar un plan de tratamiento adecuado para usted. Esta informacin no tiene como fin reemplazar el consejo del mdico. Asegrese de hacerle al mdico cualquier pregunta que tenga. Document Released: 10/19/2008 Document Revised: 07/24/2014 Document Reviewed: 11/18/2013 Elsevier Interactive Patient Education  2017 Elsevier Inc.  Gave patient hand-out, Women Staying Healthy, Active and Well from [facility], with education on breast health, pap smears, heart and colon health.  

## 2016-08-18 LAB — PAP LB AND HPV HIGH-RISK
HPV, high-risk: NEGATIVE
PAP SMEAR COMMENT: 0

## 2016-08-21 ENCOUNTER — Encounter: Payer: Self-pay | Admitting: *Deleted

## 2016-08-21 NOTE — Progress Notes (Unsigned)
Letter mailed to inform patient of her normal mammogram and pap smear.  She is to follow up in one year with annual mammogram.  Next pap in 5 years.  [facility] to [place].

## 2016-12-17 ENCOUNTER — Encounter: Payer: Self-pay | Admitting: Emergency Medicine

## 2016-12-17 ENCOUNTER — Emergency Department
Admission: EM | Admit: 2016-12-17 | Discharge: 2016-12-17 | Disposition: A | Discharge status: Home / Self Care | Payer: Self-pay | Attending: Emergency Medicine | Admitting: Emergency Medicine

## 2016-12-17 DIAGNOSIS — I10 Essential (primary) hypertension: Secondary | ICD-10-CM | POA: Insufficient documentation

## 2016-12-17 DIAGNOSIS — F10920 Alcohol use, unspecified with intoxication, uncomplicated: Secondary | ICD-10-CM

## 2016-12-17 DIAGNOSIS — F10120 Alcohol abuse with intoxication, uncomplicated: Secondary | ICD-10-CM | POA: Insufficient documentation

## 2016-12-17 LAB — CBC WITH DIFFERENTIAL/PLATELET
BASOS PCT: 1 %
Basophils Absolute: 0.1 10*3/uL (ref 0–0.1)
EOS ABS: 0.6 10*3/uL (ref 0–0.7)
EOS PCT: 5 %
HCT: 35.9 % (ref 35.0–47.0)
HEMOGLOBIN: 12.6 g/dL (ref 12.0–16.0)
Lymphocytes Relative: 15 %
Lymphs Abs: 1.6 10*3/uL (ref 1.0–3.6)
MCH: 32.9 pg (ref 26.0–34.0)
MCHC: 35.1 g/dL (ref 32.0–36.0)
MCV: 93.6 fL (ref 80.0–100.0)
Monocytes Absolute: 0.5 10*3/uL (ref 0.2–0.9)
Monocytes Relative: 5 %
NEUTROS PCT: 74 %
Neutro Abs: 7.7 10*3/uL — ABNORMAL HIGH (ref 1.4–6.5)
PLATELETS: 242 10*3/uL (ref 150–440)
RBC: 3.84 MIL/uL (ref 3.80–5.20)
RDW: 13.5 % (ref 11.5–14.5)
WBC: 10.5 10*3/uL (ref 3.6–11.0)

## 2016-12-17 LAB — COMPREHENSIVE METABOLIC PANEL
ALBUMIN: 4 g/dL (ref 3.5–5.0)
ALK PHOS: 69 U/L (ref 38–126)
ALT: 20 U/L (ref 14–54)
ANION GAP: 10 (ref 5–15)
AST: 27 U/L (ref 15–41)
BUN: 10 mg/dL (ref 6–20)
CALCIUM: 8.4 mg/dL — AB (ref 8.9–10.3)
CHLORIDE: 111 mmol/L (ref 101–111)
CO2: 22 mmol/L (ref 22–32)
Creatinine, Ser: 0.45 mg/dL (ref 0.44–1.00)
GFR calc Af Amer: >60 mL/min (ref >60)
GFR calc non Af Amer: >60 mL/min (ref >60)
GLUCOSE: 175 mg/dL — AB (ref 65–99)
Potassium: 3.3 mmol/L — ABNORMAL LOW (ref 3.5–5.1)
SODIUM: 143 mmol/L (ref 135–145)
Total Bilirubin: 0.4 mg/dL (ref 0.3–1.2)
Total Protein: 7.3 g/dL (ref 6.5–8.1)

## 2016-12-17 LAB — ETHANOL: Alcohol, Ethyl (B): 112 mg/dL — ABNORMAL HIGH (ref <5)

## 2016-12-17 MED ORDER — ACETAMINOPHEN 500 MG PO TABS
1000.0000 mg | ORAL_TABLET | Freq: Once | ORAL | Status: AC
  Administered 2016-12-17: 1000 mg via ORAL
  Filled 2016-12-17: qty 2

## 2016-12-17 MED ORDER — ONDANSETRON HCL 4 MG/2ML IJ SOLN
4.0000 mg | Freq: Once | INTRAMUSCULAR | Status: AC | PRN
  Administered 2016-12-17: 4 mg via INTRAVENOUS

## 2016-12-17 MED ORDER — PROMETHAZINE HCL 25 MG/ML IJ SOLN
12.5000 mg | Freq: Once | INTRAMUSCULAR | Status: AC
  Administered 2016-12-17: 12.5 mg via INTRAVENOUS

## 2016-12-17 MED ORDER — PROMETHAZINE HCL 25 MG/ML IJ SOLN
INTRAMUSCULAR | Status: AC
  Filled 2016-12-17: qty 1

## 2016-12-17 MED ORDER — SODIUM CHLORIDE 0.9 % IV BOLUS (SEPSIS)
1000.0000 mL | Freq: Once | INTRAVENOUS | Status: AC
  Administered 2016-12-17: 1000 mL via INTRAVENOUS

## 2016-12-17 MED ORDER — ONDANSETRON HCL 4 MG/2ML IJ SOLN
INTRAMUSCULAR | Status: AC
  Filled 2016-12-17: qty 2

## 2016-12-17 NOTE — ED Notes (Signed)
Family at bedside. 

## 2016-12-17 NOTE — Discharge Instructions (Signed)
Drink alcohol only in moderation.  Return to the ER for worsening symptoms, persistent vomiting, difficulty breathing or other concerns. 

## 2016-12-17 NOTE — ED Provider Notes (Signed)
[facility] Emergency Department Provider Note   ____________________________________________   First MD Initiated Contact with Patient 12/17/16 0401     (approximate)  I have reviewed the triage vital signs and the nursing notes.   HISTORY  Chief Complaint Alcohol Intoxication  Limited secondary to intoxication; history obtained from son  HPI Terry [name] is a 46 y.o. female but to the ED from home via EMS with a chief complaint of alcohol intoxication. States there was a party this evening and patient had too much to drink. Patient received Zofran per EMS en route to the ER. Son denies trauma. Rest of history is unobtainable secondary to patient's degree of intoxication.   Past Medical History:  Diagnosis Date  . Hypertension     There are no active problems to display for this patient.   History reviewed. No pertinent surgical history.  Prior to Admission medications   Not on File    Allergies Patient has no known allergies.  History reviewed. No pertinent family history.  Social History Social History  Substance Use Topics  . Smoking status: Never Smoker  . Smokeless tobacco: Never Used  . Alcohol use Yes    Review of Systems  Constitutional: Positive for intoxication. No fever/chills. Eyes: No visual changes. ENT: No sore throat. Cardiovascular: Denies chest pain. Respiratory: Denies shortness of breath. Gastrointestinal: No abdominal pain.  Positive for nausea and vomiting.  No diarrhea.  No constipation. Genitourinary: Negative for dysuria. Musculoskeletal: Negative for back pain. Skin: Negative for rash. Neurological: Negative for headaches, focal weakness or numbness.   ____________________________________________   PHYSICAL EXAM:  VITAL SIGNS: ED Triage Vitals  Enc Vitals Group     BP 12/17/16 0331 (!) 133/91     Pulse Rate 12/17/16 0331 100     Resp 12/17/16 0331 20     Temp 12/17/16 0331 97.4  F (36.3 C)     Temp Source 12/17/16 0331 Oral     SpO2 12/17/16 0331 100 %     Weight 12/17/16 0332 142 lb (64.4 kg)     Height 12/17/16 0332 5\' 3"  (1.6 m)     Head Circumference --      Peak Flow --      Pain Score --      Pain Loc --      Pain Edu? --      Excl. in GC? --     Constitutional: Drowsy but arousable to voice. Intoxicated.  Eyes: Conjunctivae are normal. PERRL. EOMI. Head: Atraumatic. Nose: No congestion/rhinnorhea. Mouth/Throat: Mucous membranes are moist.  Oropharynx non-erythematous. Neck: No stridor.   Cardiovascular: Normal rate, regular rhythm. Grossly normal heart sounds.  Good peripheral circulation. Respiratory: Normal respiratory effort.  No retractions. Lungs CTAB. Gastrointestinal: Soft and nontender to light and deep palpation. No distention. No abdominal bruits. No CVA tenderness. Musculoskeletal: No lower extremity tenderness nor edema.  No joint effusions. Neurologic:  Drowsy but arousable to voice. MAEx4. Intoxicated.  Skin:  Skin is warm, dry and intact. No rash noted. Psychiatric: Unable to assess. ____________________________________________   LABS (all labs ordered are listed, but only abnormal results are displayed)  Labs Reviewed  CBC WITH DIFFERENTIAL/PLATELET - Abnormal; Notable for the following:       Result Value   Neutro Abs 7.7 (*)    All other components within normal limits  COMPREHENSIVE METABOLIC PANEL - Abnormal; Notable for the following:    Potassium 3.3 (*)    Glucose, Bld 175 (*)  Calcium 8.4 (*)    All other components within normal limits  ETHANOL - Abnormal; Notable for the following:    Alcohol, Ethyl (B) 112 (*)    All other components within normal limits   ____________________________________________  EKG  None ____________________________________________  RADIOLOGY  No results found.  ____________________________________________   PROCEDURES  Procedure(s) performed:  None  Procedures  Critical Care performed: No  ____________________________________________   INITIAL IMPRESSION / ASSESSMENT AND PLAN / ED COURSE  Pertinent labs & imaging results that were available during my care of the patient were reviewed by me and considered in my medical decision making (see chart for details).  46 year old female who presents with alcohol intoxication. Patient still dry heaving after second dose of IV Zofran. Will continue IV fluids, administer Phenergan and observe.  Clinical Course as of Dec 18 602  Sun Dec 17, 2016  0516 Patient soundly asleep. No further dry heaving.  [JS]  0603 Patient is awake and ambulatory with steady gait. Used the commode, had liquids to drink and is ready for discharge home with family. Strict return precautions given. All verbalize understanding the plan of care.  [JS]    Clinical Course User Index [JS] Irean Hong, MD     ____________________________________________   FINAL CLINICAL IMPRESSION(S) / ED DIAGNOSES  Final diagnoses:  Alcoholic intoxication without complication (HCC)      NEW MEDICATIONS STARTED DURING THIS VISIT:  New Prescriptions   No medications on file     Note:  This document was prepared using Dragon voice recognition software and may include unintentional dictation errors.    Irean Hong, MD 12/17/16 [phone]

## 2016-12-17 NOTE — ED Notes (Signed)
Pt awake and ambulatory to the toilet w/o difficulty, c/o headache, Dr. Dolores FrameSung informed

## 2016-12-17 NOTE — ED Triage Notes (Signed)
Pt to rm 9 via EMS from home for etoh intoxication, pt dry heaving, given 4 zofran and LR en route.  Pt in NAD between dry heaves at this time

## 2017-10-15 ENCOUNTER — Ambulatory Visit: Payer: Self-pay | Attending: Oncology

## 2017-10-15 ENCOUNTER — Ambulatory Visit
Admission: RE | Admit: 2017-10-15 | Discharge: 2017-10-15 | Disposition: A | Discharge status: Home / Self Care | Payer: Self-pay | Source: Ambulatory Visit | Attending: Oncology | Admitting: Oncology

## 2017-10-15 VITALS — BP 126/81 | HR 81 | Temp 98.1°F | Ht 62.0 in | Wt 141.0 lb

## 2017-10-15 DIAGNOSIS — Z0000 Encounter for general adult medical examination without abnormal findings: Secondary | ICD-10-CM

## 2017-10-15 NOTE — Progress Notes (Signed)
Subjective:     Patient ID: Terry [name], female   DOB: February 01, 1971, 47 y.o.   MRN: [id number]  HPI   Review of Systems     Objective:   Physical Exam  Pulmonary/Chest: Right breast exhibits no inverted nipple, no mass, no nipple discharge, no skin change and no tenderness. Left breast exhibits no inverted nipple, no mass, no nipple discharge, no skin change and no tenderness. Breasts are symmetrical.       Assessment:    47 year old hispanic patient presents for [facility] clinic visit.  Terry [name] interpreted exam.  Patient screened, and meets [facility] eligibility.  Patient does not have insurance, Medicare or Medicaid.  Handout given on Affordable Care Act.  Instructed patient on breast self awareness using teach back method.  CBE unremarkable.  No mass or lump palpated.  Patient works at [employer].    Plan:     Sent for bilateral screening mammogram.

## 2017-10-29 NOTE — Progress Notes (Signed)
Letter mailed from [facility] to notify of normal mammogram results.  Patient to return in one year for annual screening.  Copy to [facility]. 

## 2018-10-24 ENCOUNTER — Other Ambulatory Visit: Payer: Self-pay

## 2018-10-24 ENCOUNTER — Emergency Department: Payer: Self-pay

## 2018-10-24 ENCOUNTER — Emergency Department
Admission: EM | Admit: 2018-10-24 | Discharge: 2018-10-25 | Disposition: A | Discharge status: Home / Self Care | Payer: Self-pay | Attending: Emergency Medicine | Admitting: Emergency Medicine

## 2018-10-24 ENCOUNTER — Encounter: Payer: Self-pay | Admitting: *Deleted

## 2018-10-24 DIAGNOSIS — R52 Pain, unspecified: Secondary | ICD-10-CM

## 2018-10-24 DIAGNOSIS — I10 Essential (primary) hypertension: Secondary | ICD-10-CM | POA: Insufficient documentation

## 2018-10-24 DIAGNOSIS — R079 Chest pain, unspecified: Secondary | ICD-10-CM

## 2018-10-24 LAB — COMPREHENSIVE METABOLIC PANEL
ALT: 59 U/L — ABNORMAL HIGH (ref 0–44)
AST: 66 U/L — ABNORMAL HIGH (ref 15–41)
Albumin: 4.4 g/dL (ref 3.5–5.0)
Alkaline Phosphatase: 139 U/L — ABNORMAL HIGH (ref 38–126)
Anion gap: 12 (ref 5–15)
BUN: 9 mg/dL (ref 6–20)
CO2: 23 mmol/L (ref 22–32)
Calcium: 9.2 mg/dL (ref 8.9–10.3)
Chloride: 104 mmol/L (ref 98–111)
Creatinine, Ser: 0.68 mg/dL (ref 0.44–1.00)
GFR calc Af Amer: >60 mL/min (ref >60)
GFR calc non Af Amer: >60 mL/min (ref >60)
Glucose, Bld: 276 mg/dL — ABNORMAL HIGH (ref 70–99)
Potassium: 3.4 mmol/L — ABNORMAL LOW (ref 3.5–5.1)
Sodium: 139 mmol/L (ref 135–145)
Total Bilirubin: 0.5 mg/dL (ref 0.3–1.2)
Total Protein: 7.8 g/dL (ref 6.5–8.1)

## 2018-10-24 LAB — TROPONIN I: Troponin I: <0.03 ng/mL (ref <0.03)

## 2018-10-24 LAB — CBC
HCT: 38.6 % (ref 36.0–46.0)
Hemoglobin: 13.3 g/dL (ref 12.0–15.0)
MCH: 31.9 pg (ref 26.0–34.0)
MCHC: 34.5 g/dL (ref 30.0–36.0)
MCV: 92.6 fL (ref 80.0–100.0)
Platelets: 285 10*3/uL (ref 150–400)
RBC: 4.17 MIL/uL (ref 3.87–5.11)
RDW: 13.7 % (ref 11.5–15.5)
WBC: 8.8 10*3/uL (ref 4.0–10.5)
nRBC: 0 % (ref 0.0–0.2)

## 2018-10-24 MED ORDER — LORAZEPAM 0.5 MG PO TABS
0.5000 mg | ORAL_TABLET | Freq: Once | ORAL | Status: AC
  Administered 2018-10-24: 0.5 mg via ORAL
  Filled 2018-10-24: qty 1

## 2018-10-24 MED ORDER — IOHEXOL 350 MG/ML SOLN
75.0000 mL | Freq: Once | INTRAVENOUS | Status: AC | PRN
  Administered 2018-10-24: 75 mL via INTRAVENOUS

## 2018-10-24 MED ORDER — MORPHINE SULFATE (PF) 4 MG/ML IV SOLN
4.0000 mg | Freq: Once | INTRAVENOUS | Status: AC
  Administered 2018-10-24: 4 mg via INTRAVENOUS
  Filled 2018-10-24: qty 1

## 2018-10-24 MED ORDER — OXYCODONE-ACETAMINOPHEN 5-325 MG PO TABS
1.0000 | ORAL_TABLET | Freq: Once | ORAL | Status: AC
  Administered 2018-10-24: 1 via ORAL
  Filled 2018-10-24: qty 1

## 2018-10-24 MED ORDER — ONDANSETRON HCL 4 MG/2ML IJ SOLN
4.0000 mg | Freq: Once | INTRAMUSCULAR | Status: AC
  Administered 2018-10-24: 4 mg via INTRAVENOUS
  Filled 2018-10-24: qty 2

## 2018-10-24 MED ORDER — HYDROCODONE-ACETAMINOPHEN 5-325 MG PO TABS: 1.0000 | ORAL_TABLET | ORAL | 0 refills | Status: DC | PRN

## 2018-10-24 NOTE — ED Notes (Signed)
ED Provider at bedside. 

## 2018-10-24 NOTE — ED Provider Notes (Signed)
[facility] Emergency Department Provider Note  Time seen: 8:24 PM  I have reviewed the triage vital signs and the nursing notes. Spanish interpreter used for this evaluation.  HISTORY  Chief Complaint Chest Pain   HPI Shanelle Makowski is a 48 y.o. female with a past medical history of hypertension who presents to the emergency department for chest discomfort.  According to the patient since yesterday she has experienced pain in the center of her chest.  Patient states that this happened once before and she went to [facility] hospital was found to have very high blood pressure which she believes caused the pain.  Patient states her blood pressure is very high tonight as well so she came to the emergency department for evaluation.  No shortness of breath fever or cough.  Describes the pain as a 10/10 central chest pain/pressure.  Blood pressure is quite elevated currently 224/127.  No leg pain or swelling.  No cardiac history.   Past Medical History:  Diagnosis Date  . Hypertension     There are no active problems to display for this patient.   No past surgical history on file.  Prior to Admission medications   Not on File    No Known Allergies  No family history on file.  Social History Social History   Tobacco Use  . Smoking status: Never Smoker  . Smokeless tobacco: Never Used  Substance Use Topics  . Alcohol use: Yes  . Drug use: Not on file    Review of Systems Constitutional: Negative for fever. Cardiovascular: Positive for chest pain since yesterday. Respiratory: Negative for shortness of breath.  Negative for cough. Gastrointestinal: Negative for abdominal pain, vomiting  Musculoskeletal: Negative for leg pain or swelling. Skin: Negative for skin complaints  Neurological: Negative for headache All other ROS negative  ____________________________________________   PHYSICAL EXAM:  VITAL SIGNS: ED Triage Vitals  Enc Vitals  Group     BP 10/24/18 2004 (!) 224/127     Pulse Rate 10/24/18 2001 (!) 113     Resp 10/24/18 2001 (!) 22     Temp 10/24/18 2001 98.5 F (36.9 C)     Temp Source 10/24/18 2001 Oral     SpO2 10/24/18 2001 95 %     Weight 10/24/18 2001 145 lb (65.8 kg)     Height 10/24/18 2001 5\' 1"  (1.549 m)     Head Circumference --      Peak Flow --      Pain Score 10/24/18 2001 10     Pain Loc --      Pain Edu? --      Excl. in GC? --     Constitutional: Alert and oriented. Well appearing and in no distress. Eyes: Normal exam ENT   Head: Normocephalic and atraumatic.   Mouth/Throat: Mucous membranes are moist. Cardiovascular: Normal rate, regular rhythm.  Respiratory: Normal respiratory effort without tachypnea nor retractions. Breath sounds are clear Gastrointestinal: Soft and nontender. No distention.   Musculoskeletal: Recent shoulder surgery currently in a sling in the right upper extremity. Neurologic:  Normal speech and language. No gross focal neurologic deficits  Skin:  Skin is warm, dry and intact.  Psychiatric: Appears anxious.  ____________________________________________    EKG  EKG viewed and interpreted by myself shows sinus tachycardia 108 bpm with a narrow QRS, normal axis, normal intervals, no concerning ST changes.  ____________________________________________    RADIOLOGY  Chest x-ray negative  ____________________________________________   INITIAL IMPRESSION /  ASSESSMENT AND PLAN / ED COURSE  Pertinent labs & imaging results that were available during my care of the patient were reviewed by me and considered in my medical decision making (see chart for details).  Patient presents to the emergency department for chest pain and high blood pressure starting yesterday.  Patient is quite hypertensive 224/127.  Patient is quite anxious appearing as well.  We will check labs, treat pain which should help with the blood pressure as well, obtain an EKG and chest  x-ray and continue to closely monitor.  Patient agreeable to plan of care.  Patient states her pain is somewhat improved after pain medication.  Blood pressures currently down to 164/112.  Patient states she continues to have chest pain however rates it as a 7/10 in the middle of her chest.  States that it feels like it is worse with deep inspiration.  Patient does have her right upper extremity in a sling, I do suspect that some of this chest pain could very likely be chest wall related due to arm positioning.  States she is no longer taking pain medication at home.  We will obtain a CT angiography of the chest as a precaution rule out pulmonary embolism.  Patient states the chest pain for started yesterday, given her negative troponin and I do not suspect ACS.  LFTs are mildly elevated however compared to prior labs they have been elevated similarly in the past, and the patient has no abdominal pain specifically no right upper quadrant pain.  If CTA is negative anticipate likely discharge home with a short course of pain medication and PCP follow-up.   No acute findings on CTA of the chest.  We will discharge with short course of pain medication.  Patient agreeable plan of care.  ____________________________________________   FINAL CLINICAL IMPRESSION(S) / ED DIAGNOSES  Chest pain Hypertension   Minna AntisPaduchowski, Alastair Hennes, MD 10/24/18 2330

## 2018-10-24 NOTE — ED Triage Notes (Signed)
Pt to triage via wheelchair.  Pt has chest pain since yesterday.  Pain radiates into the neck.  Pt is wearing a sling on right shoulder.  Pt denies cough, fever.  Pt reports sob.  Non smoker.  Pt alert.

## 2018-11-14 ENCOUNTER — Other Ambulatory Visit: Payer: Self-pay

## 2018-11-14 ENCOUNTER — Emergency Department: Payer: Self-pay

## 2018-11-14 ENCOUNTER — Emergency Department
Admission: EM | Admit: 2018-11-14 | Discharge: 2018-11-14 | Disposition: A | Discharge status: Home / Self Care | Payer: Self-pay | Attending: Emergency Medicine | Admitting: Emergency Medicine

## 2018-11-14 DIAGNOSIS — R109 Unspecified abdominal pain: Secondary | ICD-10-CM

## 2018-11-14 DIAGNOSIS — I10 Essential (primary) hypertension: Secondary | ICD-10-CM | POA: Insufficient documentation

## 2018-11-14 DIAGNOSIS — R110 Nausea: Secondary | ICD-10-CM | POA: Insufficient documentation

## 2018-11-14 DIAGNOSIS — R1013 Epigastric pain: Secondary | ICD-10-CM | POA: Insufficient documentation

## 2018-11-14 LAB — URINALYSIS, COMPLETE (UACMP) WITH MICROSCOPIC
Bilirubin Urine: NEGATIVE
Glucose, UA: >=500 mg/dL — AB
Hgb urine dipstick: NEGATIVE
Ketones, ur: NEGATIVE mg/dL
Nitrite: NEGATIVE
Protein, ur: 100 mg/dL — AB
Specific Gravity, Urine: 1.015 (ref 1.005–1.030)
pH: 6 (ref 5.0–8.0)

## 2018-11-14 LAB — COMPREHENSIVE METABOLIC PANEL
ALT: 99 U/L — ABNORMAL HIGH (ref 0–44)
AST: 138 U/L — ABNORMAL HIGH (ref 15–41)
Albumin: 5.2 g/dL — ABNORMAL HIGH (ref 3.5–5.0)
Alkaline Phosphatase: 141 U/L — ABNORMAL HIGH (ref 38–126)
Anion gap: 15 (ref 5–15)
BUN: 28 mg/dL — ABNORMAL HIGH (ref 6–20)
CO2: 24 mmol/L (ref 22–32)
Calcium: 9.7 mg/dL (ref 8.9–10.3)
Chloride: 96 mmol/L — ABNORMAL LOW (ref 98–111)
Creatinine, Ser: 1.57 mg/dL — ABNORMAL HIGH (ref 0.44–1.00)
GFR calc Af Amer: 44 mL/min — ABNORMAL LOW (ref >60)
GFR calc non Af Amer: 38 mL/min — ABNORMAL LOW (ref >60)
Glucose, Bld: 167 mg/dL — ABNORMAL HIGH (ref 70–99)
Potassium: 3.4 mmol/L — ABNORMAL LOW (ref 3.5–5.1)
Sodium: 135 mmol/L (ref 135–145)
Total Bilirubin: 0.8 mg/dL (ref 0.3–1.2)
Total Protein: 9.5 g/dL — ABNORMAL HIGH (ref 6.5–8.1)

## 2018-11-14 LAB — CBC
HCT: 46.6 % — ABNORMAL HIGH (ref 36.0–46.0)
Hemoglobin: 15.7 g/dL — ABNORMAL HIGH (ref 12.0–15.0)
MCH: 31.3 pg (ref 26.0–34.0)
MCHC: 33.7 g/dL (ref 30.0–36.0)
MCV: 92.8 fL (ref 80.0–100.0)
Platelets: 354 10*3/uL (ref 150–400)
RBC: 5.02 MIL/uL (ref 3.87–5.11)
RDW: 13.5 % (ref 11.5–15.5)
WBC: 8.9 10*3/uL (ref 4.0–10.5)
nRBC: 0 % (ref 0.0–0.2)

## 2018-11-14 LAB — LIPASE, BLOOD: Lipase: 25 U/L (ref 11–51)

## 2018-11-14 LAB — TROPONIN I: Troponin I: <0.03 ng/mL (ref <0.03)

## 2018-11-14 MED ORDER — IOHEXOL 240 MG/ML SOLN
50.0000 mL | Freq: Once | INTRAMUSCULAR | Status: AC
  Administered 2018-11-14: 50 mL via ORAL

## 2018-11-14 MED ORDER — IOHEXOL 240 MG/ML SOLN: 50.0000 mL | Freq: Once | INTRAMUSCULAR | Status: DC

## 2018-11-14 MED ORDER — SODIUM CHLORIDE 0.9 % IV BOLUS
1000.0000 mL | Freq: Once | INTRAVENOUS | Status: AC
  Administered 2018-11-14: 15:00:00 1000 mL via INTRAVENOUS

## 2018-11-14 MED ORDER — FOSFOMYCIN TROMETHAMINE 3 G PO PACK
3.0000 g | PACK | Freq: Once | ORAL | Status: AC
  Administered 2018-11-14: 19:00:00 3 g via ORAL
  Filled 2018-11-14: qty 3

## 2018-11-14 MED ORDER — PROCHLORPERAZINE EDISYLATE 10 MG/2ML IJ SOLN
10.0000 mg | Freq: Once | INTRAMUSCULAR | Status: AC
  Administered 2018-11-14: 17:00:00 10 mg via INTRAVENOUS
  Filled 2018-11-14: qty 2

## 2018-11-14 MED ORDER — ALUM & MAG HYDROXIDE-SIMETH 200-200-20 MG/5ML PO SUSP
30.0000 mL | Freq: Once | ORAL | Status: AC
  Administered 2018-11-14: 14:00:00 30 mL via ORAL
  Filled 2018-11-14: qty 30

## 2018-11-14 MED ORDER — IOHEXOL 300 MG/ML  SOLN
75.0000 mL | Freq: Once | INTRAMUSCULAR | Status: AC | PRN
  Administered 2018-11-14: 16:00:00 75 mL via INTRAVENOUS

## 2018-11-14 MED ORDER — LIDOCAINE VISCOUS HCL 2 % MT SOLN
15.0000 mL | Freq: Once | OROMUCOSAL | Status: AC
  Administered 2018-11-14: 15 mL via ORAL
  Filled 2018-11-14: qty 15

## 2018-11-14 MED ORDER — ONDANSETRON HCL 4 MG PO TABS: 4.0000 mg | ORAL_TABLET | Freq: Three times a day (TID) | ORAL | 0 refills | Status: DC | PRN

## 2018-11-14 MED ORDER — SODIUM CHLORIDE 0.9 % IV BOLUS
1000.0000 mL | Freq: Once | INTRAVENOUS | Status: AC
  Administered 2018-11-14: 14:00:00 1000 mL via INTRAVENOUS

## 2018-11-14 MED ORDER — ONDANSETRON HCL 4 MG/2ML IJ SOLN
4.0000 mg | Freq: Once | INTRAMUSCULAR | Status: AC
  Administered 2018-11-14: 4 mg via INTRAVENOUS
  Filled 2018-11-14: qty 2

## 2018-11-14 NOTE — ED Notes (Signed)
Informed CT patient finished with contrast drink and ready for CT

## 2018-11-14 NOTE — ED Provider Notes (Signed)
[facility] Emergency Department Provider Note  Time seen: 1:58 PM  I have reviewed the triage vital signs and the nursing notes.   HISTORY  Chief Complaint Nausea   HPI Terry [name] is a 48 y.o. female with a past medical history of hypertension presents to the emergency department with various complaints.  According to the patient she was seen here 10/24/2018 for chest pain.  Patient was actually seen by myself during the visit had a work-up performed including lab work and a CT angiography of the chest with normal findings.  Patient was found to have high blood pressure but otherwise reassuring work-up.  Patient states ever since that time she is continued to have intermittent nausea, intermittent epigastric/left upper quadrant abdominal discomfort.  Patient saw her doctor and was told that her blood sugars elevated was started on the medication for diabetes although states she has not been checking her blood sugar at home.  Patient denies any fever cough or congestion.  Denies any dysuria.  States intermittent nausea.  Denies any diarrhea.   Past Medical History:  Diagnosis Date  . Hypertension     There are no active problems to display for this patient.   History reviewed. No pertinent surgical history.  Prior to Admission medications   Medication Sig Start Date End Date Taking? Authorizing Provider  HYDROcodone-acetaminophen (NORCO/VICODIN) 5-325 MG tablet Take 1 tablet by mouth every 4 (four) hours as needed. 10/24/18   Minna AntisPaduchowski, Ladarryl Wrage, MD    No Known Allergies  History reviewed. No pertinent family history.  Social History Social History   Tobacco Use  . Smoking status: Never Smoker  . Smokeless tobacco: Never Used  Substance Use Topics  . Alcohol use: Yes  . Drug use: Not on file    Review of Systems Constitutional: Negative for fever. ENT: Negative for recent illness/congestion Cardiovascular: Negative for chest  pain. Respiratory: Negative for shortness of breath. Gastrointestinal: Intermittent epigastric left upper quadrant abdominal discomfort.  Positive for nausea.  Negative for diarrhea. Genitourinary: Negative for urinary compaints Musculoskeletal: Negative for musculoskeletal complaints Skin: Negative for skin complaints  Neurological: Negative for headache All other ROS negative  ____________________________________________   PHYSICAL EXAM:  VITAL SIGNS: ED Triage Vitals  Enc Vitals Group     BP 11/14/18 1354 110/81     Pulse Rate 11/14/18 1354 98     Resp 11/14/18 1354 20     Temp 11/14/18 1354 97.9 F (36.6 C)     Temp Source 11/14/18 1354 Oral     SpO2 11/14/18 1353 98 %     Weight 11/14/18 1355 140 lb (63.5 kg)     Height 11/14/18 1355 5\' 2"  (1.575 m)     Head Circumference --      Peak Flow --      Pain Score --      Pain Loc --      Pain Edu? --      Excl. in GC? --     Constitutional: Alert and oriented. Well appearing and in no distress. Eyes: Normal exam ENT      Head: Normocephalic and atraumatic.      Mouth/Throat: Mucous membranes are moist. Cardiovascular: Normal rate, regular rhythm.  Respiratory: Normal respiratory effort without tachypnea nor retractions. Breath sounds are clear  Gastrointestinal: Soft, mild epigastric and left upper quadrant tenderness palpation, no rebound guarding or distention. Musculoskeletal: Nontender with normal range of motion in all extremities. Neurologic:  Normal speech and language. No  gross focal neurologic deficits  Skin:  Skin is warm, dry and intact.  Psychiatric: Mood and affect are normal.     RADIOLOGY  CT pending  ____________________________________________   INITIAL IMPRESSION / ASSESSMENT AND PLAN / ED COURSE  Pertinent labs & imaging results that were available during my care of the patient were reviewed by me and considered in my medical decision making (see chart for details).   Patient presents  emergency department for nausea and intermittent epigastric left upper quadrant abdominal discomfort ongoing over the past 3 weeks.  Overall the patient appears very well on examination, has very mild epigastric and left upper quadrant tenderness otherwise benign abdomen.  Otherwise reassuring exam.  We will check labs including cardiac enzymes, CMP and LFTs.  We will check a urinalysis.  We will treat the patient's nausea, try a GI cocktail to see if this helps with the patient's discomfort.  Differential at this time is quite broad but would include pancreatitis, gastritis, gastroenteritis, gallbladder pathology, colitis, diverticulitis among other intra-abdominal pathology.  Patient's work-up shows moderate renal insufficiency and dehydration.  We will dose a second liter of IV fluids.  Patient's LFTs are mildly elevated.  Patient is status post cholecystectomy, with a normal total bilirubin.  Possible viral process.  Given the patient's abdominal pain we will obtain CT imaging of the abdomen/pelvis to further evaluate as well.  Patient's urinalysis does show possible mild urinary tract infection.  We will dose a one-time dose of fosfomycin and send a urine culture as a precaution.  CT pending, patient care signed out to oncoming physician.  Terry [name] was evaluated in Emergency Department on 11/14/2018 for the symptoms described in the history of present illness. She was evaluated in the context of the global COVID-19 pandemic, which necessitated consideration that the patient might be at risk for infection with the SARS-CoV-2 virus that causes COVID-19. Institutional protocols and algorithms that pertain to the evaluation of patients at risk for COVID-19 are in a state of rapid change based on information released by regulatory bodies including the CDC and federal and state organizations. These policies and algorithms were followed during the patient's care in the  ED.  ____________________________________________   FINAL CLINICAL IMPRESSION(S) / ED DIAGNOSES  Nausea Abdominal pain   Minna Antis, MD 11/14/18 1502

## 2018-11-14 NOTE — ED Provider Notes (Signed)
CT abd/pel No acute abnormality.   Discussed ct scan results with the patient with help of interpreter. Patient did feel better with medication. Will plan on discharging home.    Phineas Semen, MD 11/14/18 Rickey Primus

## 2018-11-14 NOTE — ED Triage Notes (Signed)
Pt reports nausea and stomach pain x 2 weeks. She states her doctor wanted to inform us that she has hx of HTN.

## 2018-11-14 NOTE — ED Notes (Signed)
Pt states nausea has improved, she states she still has some nausea but has noticed some relief

## 2018-11-14 NOTE — Discharge Instructions (Addendum)
Please seek medical attention for any high fevers, chest pain, shortness of breath, change in behavior, persistent vomiting, bloody stool or any other new or concerning symptoms.  

## 2019-02-02 ENCOUNTER — Other Ambulatory Visit: Payer: Self-pay

## 2019-02-02 ENCOUNTER — Emergency Department: Payer: HRSA Program

## 2019-02-02 ENCOUNTER — Emergency Department
Admission: EM | Admit: 2019-02-02 | Discharge: 2019-02-03 | Disposition: A | Discharge status: Home / Self Care | Payer: HRSA Program | Attending: Emergency Medicine | Admitting: Emergency Medicine

## 2019-02-02 ENCOUNTER — Encounter: Payer: Self-pay | Admitting: Emergency Medicine

## 2019-02-02 DIAGNOSIS — Z79899 Other long term (current) drug therapy: Secondary | ICD-10-CM | POA: Insufficient documentation

## 2019-02-02 DIAGNOSIS — R109 Unspecified abdominal pain: Secondary | ICD-10-CM | POA: Diagnosis not present

## 2019-02-02 DIAGNOSIS — I10 Essential (primary) hypertension: Secondary | ICD-10-CM | POA: Insufficient documentation

## 2019-02-02 DIAGNOSIS — Z7984 Long term (current) use of oral hypoglycemic drugs: Secondary | ICD-10-CM | POA: Diagnosis not present

## 2019-02-02 DIAGNOSIS — B342 Coronavirus infection, unspecified: Secondary | ICD-10-CM

## 2019-02-02 DIAGNOSIS — U071 COVID-19: Secondary | ICD-10-CM | POA: Insufficient documentation

## 2019-02-02 DIAGNOSIS — R079 Chest pain, unspecified: Secondary | ICD-10-CM | POA: Diagnosis present

## 2019-02-02 LAB — TROPONIN I (HIGH SENSITIVITY): Troponin I (High Sensitivity): 15 ng/L (ref <18)

## 2019-02-02 LAB — URINALYSIS, ROUTINE W REFLEX MICROSCOPIC
Bacteria, UA: NONE SEEN
Bilirubin Urine: NEGATIVE
Glucose, UA: 50 mg/dL — AB
Ketones, ur: NEGATIVE mg/dL
Nitrite: NEGATIVE
Protein, ur: 100 mg/dL — AB
Specific Gravity, Urine: 1.019 (ref 1.005–1.030)
pH: 6 (ref 5.0–8.0)

## 2019-02-02 LAB — PREGNANCY, URINE: Preg Test, Ur: NEGATIVE

## 2019-02-02 LAB — CBC WITH DIFFERENTIAL/PLATELET
Abs Immature Granulocytes: 0.03 10*3/uL (ref 0.00–0.07)
Basophils Absolute: 0.1 10*3/uL (ref 0.0–0.1)
Basophils Relative: 1 %
Eosinophils Absolute: 0.1 10*3/uL (ref 0.0–0.5)
Eosinophils Relative: 1 %
HCT: 41.1 % (ref 36.0–46.0)
Hemoglobin: 14.2 g/dL (ref 12.0–15.0)
Immature Granulocytes: 0 %
Lymphocytes Relative: 37 %
Lymphs Abs: 3.6 10*3/uL (ref 0.7–4.0)
MCH: 30.9 pg (ref 26.0–34.0)
MCHC: 34.5 g/dL (ref 30.0–36.0)
MCV: 89.3 fL (ref 80.0–100.0)
Monocytes Absolute: 0.6 10*3/uL (ref 0.1–1.0)
Monocytes Relative: 7 %
Neutro Abs: 5.2 10*3/uL (ref 1.7–7.7)
Neutrophils Relative %: 54 %
Platelets: 346 10*3/uL (ref 150–400)
RBC: 4.6 MIL/uL (ref 3.87–5.11)
RDW: 13.6 % (ref 11.5–15.5)
WBC: 9.7 10*3/uL (ref 4.0–10.5)
nRBC: 0 % (ref 0.0–0.2)

## 2019-02-02 LAB — COMPREHENSIVE METABOLIC PANEL
ALT: 137 U/L — ABNORMAL HIGH (ref 0–44)
AST: 184 U/L — ABNORMAL HIGH (ref 15–41)
Albumin: 4.4 g/dL (ref 3.5–5.0)
Alkaline Phosphatase: 177 U/L — ABNORMAL HIGH (ref 38–126)
Anion gap: 11 (ref 5–15)
BUN: 11 mg/dL (ref 6–20)
CO2: 23 mmol/L (ref 22–32)
Calcium: 9.1 mg/dL (ref 8.9–10.3)
Chloride: 105 mmol/L (ref 98–111)
Creatinine, Ser: 0.61 mg/dL (ref 0.44–1.00)
GFR calc Af Amer: >60 mL/min (ref >60)
GFR calc non Af Amer: >60 mL/min (ref >60)
Glucose, Bld: 197 mg/dL — ABNORMAL HIGH (ref 70–99)
Potassium: 3.2 mmol/L — ABNORMAL LOW (ref 3.5–5.1)
Sodium: 139 mmol/L (ref 135–145)
Total Bilirubin: 0.6 mg/dL (ref 0.3–1.2)
Total Protein: 8.8 g/dL — ABNORMAL HIGH (ref 6.5–8.1)

## 2019-02-02 LAB — LIPASE, BLOOD: Lipase: 28 U/L (ref 11–51)

## 2019-02-02 MED ORDER — ACETAMINOPHEN 500 MG PO TABS
1000.0000 mg | ORAL_TABLET | Freq: Once | ORAL | Status: AC
  Administered 2019-02-02: 22:00:00 1000 mg via ORAL
  Filled 2019-02-02: qty 2

## 2019-02-02 MED ORDER — SODIUM CHLORIDE 0.9 % IV BOLUS
500.0000 mL | Freq: Once | INTRAVENOUS | Status: AC
  Administered 2019-02-02: 500 mL via INTRAVENOUS

## 2019-02-02 MED ORDER — IOPAMIDOL (ISOVUE-370) INJECTION 76%: 100.0000 mL | Freq: Once | INTRAVENOUS | Status: DC | PRN

## 2019-02-02 MED ORDER — KETOROLAC TROMETHAMINE 30 MG/ML IJ SOLN
30.0000 mg | Freq: Once | INTRAMUSCULAR | Status: AC
  Administered 2019-02-02: 30 mg via INTRAVENOUS
  Filled 2019-02-02: qty 1

## 2019-02-02 MED ORDER — ONDANSETRON HCL 4 MG/2ML IJ SOLN
4.0000 mg | Freq: Once | INTRAMUSCULAR | Status: AC
  Administered 2019-02-02: 22:00:00 4 mg via INTRAVENOUS
  Filled 2019-02-02: qty 2

## 2019-02-02 NOTE — ED Triage Notes (Signed)
Pt arrives POV to triage with c/o chest pain and SOB. Pt was tested positive for Covid on Saturday. Pt is working to get full breaths at this time.

## 2019-02-02 NOTE — ED Provider Notes (Addendum)
[facility] Emergency Department Provider Note  ____________________________________________   First MD Initiated Contact with Patient 02/02/19 2145     (approximate)  I have reviewed the triage vital signs and the nursing notes.   HISTORY  Chief Complaint Chest Pain    HPI Terry [name] is a 48 y.o. female with hypertension who presents for shortness of breath.  Patient was tested positive for corona virus 1 week ago.  She presents today for increasing shortness of breath and some chest discomfort.  Patient also is having some abdominal pain.  Patient symptoms started getting worse last 2 days.  Nothing seems to make them better, nothing makes them worse.  Patient says she has been eating less than normal.  She also is endorsing fevers.     Past Medical History:  Diagnosis Date  . Hypertension     There are no active problems to display for this patient.   History reviewed. No pertinent surgical history.  Prior to Admission medications   Medication Sig Start Date End Date Taking? Authorizing Provider  acetaminophen (TYLENOL) 325 MG tablet Take 650 mg by mouth every 6 (six) hours as needed for pain. 09/26/18   [provider]  amitriptyline (ELAVIL) 25 MG tablet Take 25 mg by mouth at bedtime. 01/03/18   [provider]  amLODipine (NORVASC) 10 MG tablet Take 10 mg by mouth daily.    [provider]  chlorthalidone (HYGROTON) 50 MG tablet Take 50 mg by mouth daily.    [provider]  diclofenac sodium (VOLTAREN) 1 % GEL Apply 2 g topically 4 (four) times daily. 07/01/18 07/01/19  [provider]  DULoxetine (CYMBALTA) 30 MG capsule Take 60 mg by mouth daily. 03/25/18 03/25/19  [provider]  glipiZIDE (GLUCOTROL) 5 MG tablet Take 5 mg by mouth daily. 11/11/18 11/11/19  [provider]  HYDROcodone-acetaminophen (NORCO/VICODIN) 5-325 MG tablet Take 1 tablet by mouth every 4 (four)  hours as needed. 10/24/18   Minna AntisPaduchowski, Kevin, MD  lisinopril (ZESTRIL) 40 MG tablet Take 40 mg by mouth daily.    [provider]  loratadine (CLARITIN) 10 MG tablet Take 10 mg by mouth daily.    [provider]  metoprolol succinate (TOPROL-XL) 25 MG 24 hr tablet Take 25 mg by mouth daily.    [provider]  ondansetron (ZOFRAN) 4 MG tablet Take 1 tablet (4 mg total) by mouth every 8 (eight) hours as needed. 11/14/18   Phineas SemenGoodman, Graydon, MD  promethazine (PHENERGAN) 12.5 MG tablet Take 12.5 mg by mouth every 6 (six) hours as needed for nausea. 11/11/18   [provider]    Allergies Hydrocodone-acetaminophen  No family history on file.  Social History Social History   Tobacco Use  . Smoking status: Never Smoker  . Smokeless tobacco: Never Used  Substance Use Topics  . Alcohol use: Yes  . Drug use: Not Currently      Review of Systems Constitutional: No fever/chills Eyes: No visual changes. ENT: No sore throat. Cardiovascular: Positive for chest pain Respiratory: Positive for SOB Gastrointestinal: Positive for abdominal pain no nausea, no vomiting.  No diarrhea.  No constipation. Genitourinary: Negative for dysuria. Musculoskeletal: Negative for back pain. Skin: Negative for rash. Neurological: Negative for headaches, focal weakness or numbness. All other ROS negative ____________________________________________   PHYSICAL EXAM:  VITAL SIGNS: ED Triage Vitals  Enc Vitals Group     BP 02/02/19 2126 (!) 160/123     Pulse Rate 02/02/19  2126 (!) 112     Resp 02/02/19 2126 (!) 26     Temp 02/02/19 2126 98.7 F (37.1 C)     Temp Source 02/02/19 2126 Oral     SpO2 02/02/19 2126 95 %     Weight 02/02/19 2134 130 lb (59 kg)     Height 02/02/19 2134 5\' 1"  (1.549 m)     Head Circumference --      Peak Flow --      Pain Score 02/02/19 2126 10     Pain Loc --      Pain Edu? --      Excl. in GC? --     Constitutional: Alert and  oriented. Well appearing and in no acute distress. Eyes: Conjunctivae are normal. EOMI. Head: Atraumatic. Nose: No congestion/rhinnorhea. Mouth/Throat: Mucous membranes are moist.   Neck: No stridor. Trachea Midline. FROM Cardiovascular: Normal rate, regular rhythm. Grossly normal heart sounds.  Good peripheral circulation. Respiratory: Coarse breath sounds, no increased work of breathing Gastrointestinal: Some abdominal tenderness.  No distention. No abdominal bruits.  Musculoskeletal: No lower extremity tenderness nor edema.  No joint effusions. Neurologic:  Normal speech and language. No gross focal neurologic deficits are appreciated.  Skin:  Skin is warm, dry and intact. No rash noted. Psychiatric: Mood and affect are normal. Speech and behavior are normal. GU: Deferred   ____________________________________________   LABS (all labs ordered are listed, but only abnormal results are displayed)  Labs Reviewed  COMPREHENSIVE METABOLIC PANEL - Abnormal; Notable for the following components:      Result Value   Potassium 3.2 (*)    Glucose, Bld 197 (*)    Total Protein 8.8 (*)    AST 184 (*)    ALT 137 (*)    Alkaline Phosphatase 177 (*)    All other components within normal limits  URINALYSIS, ROUTINE W REFLEX MICROSCOPIC - Abnormal; Notable for the following components:   Color, Urine YELLOW (*)    APPearance HAZY (*)    Glucose, UA 50 (*)    Hgb urine dipstick MODERATE (*)    Protein, ur 100 (*)    Leukocytes,Ua TRACE (*)    All other components within normal limits  CBC WITH DIFFERENTIAL/PLATELET  LIPASE, BLOOD  PREGNANCY, URINE  TROPONIN I (HIGH SENSITIVITY)  TROPONIN I (HIGH SENSITIVITY)   ____________________________________________   ED ECG REPORT I, Concha SeMary E Warwick Nick, the attending physician, personally viewed and interpreted this ECG.  Sinus rate of 96, no st elevation, TWI lead 3, normal intervals.  This looks similar to prior EKG.   ____________________________________________  RADIOLOGY Terry [name], Terry [name], personally viewed and evaluated these images (plain radiographs) as part of my medical decision making, as well as reviewing the written report by the radiologist.  ED MD interpretation: Chest x-ray is consistent with coronavirus.  Official radiology report(s): Dg Chest 1 View  Result Date: 02/02/2019 CLINICAL DATA:  Chest pain and shortness of breath. COVID-19 positive EXAM: CHEST  1 VIEW COMPARISON:  Radiographs and CT 10/24/2018 FINDINGS: Low lung volumes. Normal heart size and mediastinal contours. Minimal vague heterogeneous opacities in the lung bases, right greater than left. No pulmonary edema, pleural fluid, or pneumothorax. Incidental note of gaseous gastric distention in the upper abdomen. IMPRESSION: Low lung volumes with vague bibasilar opacities, likely representing mild COVID-19 pneumonia. Electronically Signed   By: Narda RutherfordMelanie  Sanford M.D.   On: 02/02/2019 22:24    ____________________________________________   PROCEDURES  Procedure(s) performed (including Critical Care):  Procedures  ____________________________________________   INITIAL IMPRESSION / ASSESSMENT AND PLAN / ED COURSE   Terry [name] was evaluated in Emergency Department on 02/02/2019 for the symptoms described in the history of present illness. She was evaluated in the context of the global COVID-19 pandemic, which necessitated consideration that the patient might be at risk for infection with the SARS-CoV-2 virus that causes COVID-19. Institutional protocols and algorithms that pertain to the evaluation of patients at risk for COVID-19 are in a state of rapid change based on information released by regulatory bodies including the CDC and federal and state organizations. These policies and algorithms were followed during the patient's care in the ED.     Patient shortness of breath is most likely secondary to her  coronavirus.  Patient has not required any oxygen.  Will get labs to further evaluate electrolyte abnormalities and treat patient symptoms. Will get abdominal labs and UA to evaluate abd pain.   PNA-will get xray to evaluation Anemia-CBC to evaluate ACS- will get trops Arrhythmia-Will get EKG and keep on monitor.  PE-lower suspicion given no risk factors and coronavirus more likely.  Patient is satting 95% on room air and has no increased work of breathing.    Clinical Course as of Feb 01 2338  Nancy Fetter Feb 02, 2019  2221 White count is normal   [MF]    Clinical Course User Index [MF] Vanessa [place], MD    White count is normal which is reassuring.  Potassium slightly low at 3.2 and will give her some oral potassium.  Sugar slightly elevated without anion gap.  LFTs are slightly elevated but have been similarly elevated in the past.  UA with blood and calcium oxalate crystals. Denies urinary symptoms to suggest UTI.   Given patient's abdominal pain will get a CT scan to rule out septic stone.  Patient handed off to incoming team pending CT scan.  If CT scan is normal patient will be able to be discharged home.  Patient is able to ambulate without desaturation.  Patient looks very well upon examination.  Patient pending  CT scan if negative patient could be discharged back to home   ____________________________________________   FINAL CLINICAL IMPRESSION(S) / ED DIAGNOSES   Final diagnoses:  Coronavirus infection     MEDICATIONS GIVEN DURING THIS VISIT:  Medications  iopamidol (ISOVUE-370) 76 % injection 100 mL (has no administration in time range)  acetaminophen (TYLENOL) tablet 1,000 mg (1,000 mg Oral Given 02/02/19 2205)  ketorolac (TORADOL) 30 MG/ML injection 30 mg (30 mg Intravenous Given 02/02/19 2201)  ondansetron (ZOFRAN) injection 4 mg (4 mg Intravenous Given 02/02/19 2201)  sodium chloride 0.9 % bolus 500 mL (500 mLs Intravenous New Bag/Given 02/02/19 2207)     ED  Discharge Orders    None       Note:  This document was prepared using Dragon voice recognition software and may include unintentional dictation errors.     Vanessa [place], MD 02/03/19 [phone]

## 2019-02-02 NOTE — Discharge Instructions (Addendum)
Your work-up was reassuring.  Your potassium was slightly low at 3.2.  Liver enzymes were slightly elevated but these have been elevated in the past.  Should continue to stay hydrated.  Take Tylenol for your pain. Take zofran for the nausea.  Return to the ER for increased shortness of breath.   QUARANTINE INSTRUCTION  Follow these instructions at home:  Protecting others To avoid spreading the illness to other people: Quarantine in your home until you have had no cough and fever for 7 days. Household members should also be quarantine for at least 14 days after being exposed to you. Wash your hands often with soap and water. If soap and water are not available, use an alcohol-based hand sanitizer. If you have not cleaned your hands, do not touch your face. Make sure that all people in your household wash their hands well and often. Cover your nose and mouth when you cough or sneeze. Throw away used tissues. Stay home if you have any cold-like or flu-like symptoms. General instructions Go to your local pharmacy and buy a pulse oximeter (this is a machine that measures your oxygen). Check your oxygen levels at least 3 times a day. If your oxygen level is 92% or less return to the emergency room immediately Take over-the-counter and prescription medicines only as told by your health care provider. If you need medication for fever take tylenol or ibuprofen Drink enough fluid to keep your urine pale yellow. Rest at home as directed by your health care provider. Do not give aspirin to a child with the flu, because of the association with Reye's syndrome. Do not use tobacco products, including cigarettes, chewing tobacco, and e-cigarettes. If you need help quitting, ask your health care provider. Keep all follow-up visits as told by your health care provider. This is important. How is this prevented? Avoid areas where an outbreak has been reported. Avoid large groups of people. Keep a safe  distance from people who are coughing and sneezing. Do not touch your face if you have not cleaned your hands. When you are around people who are sick or might be sick, wear a mask to protect yourself. Contact a health care provider if: You have symptoms of SARS (cough, fever, chest pain, shortness of breath) that are not getting better at home. You have a fever. If you have difficulty breathing go to your local ER or call 911

## 2019-02-03 MED ORDER — IOHEXOL 300 MG/ML  SOLN
100.0000 mL | Freq: Once | INTRAMUSCULAR | Status: AC | PRN
  Administered 2019-02-03: 100 mL via INTRAVENOUS

## 2019-02-03 MED ORDER — FENTANYL CITRATE (PF) 100 MCG/2ML IJ SOLN: 50.0000 ug | Freq: Once | INTRAMUSCULAR | Status: DC

## 2019-02-03 MED ORDER — POTASSIUM CHLORIDE CRYS ER 20 MEQ PO TBCR
40.0000 meq | EXTENDED_RELEASE_TABLET | Freq: Once | ORAL | Status: AC
  Administered 2019-02-03: 01:00:00 40 meq via ORAL
  Filled 2019-02-03: qty 2

## 2019-02-03 MED ORDER — ONDANSETRON HCL 4 MG PO TABS: 4.0000 mg | ORAL_TABLET | Freq: Every day | ORAL | 0 refills | Status: AC | PRN

## 2019-02-03 NOTE — ED Notes (Addendum)
Pt given PO trial, sandwich tray and apple juice; Pt asked to call for ride

## 2019-02-03 NOTE — ED Provider Notes (Signed)
-----------------------------------------   1:11 AM on 02/03/2019 -----------------------------------------   Blood pressure (!) 156/103, pulse 94, temperature 98.7 F (37.1 C), temperature source Oral, resp. rate (!) 23, height 5\' 1"  (1.549 m), weight 59 kg, SpO2 94 %.  Assuming care from Dr. Jari Pigg of [place] is a 48 y.o. female with a chief complaint of Chest Pain .    Please refer to H&P by previous MD for further details.  The current plan of care is to to rule out septic ureteral stone with CT a/p.  CT negative for acute findings. Patient is positive for COVID 19 with normal WOB, normal sats both at rest and with ambulation and no hypoxia. Quarantine, pulse ox monitoring, and supportive care discussed with patient. Patient will be dc per plan left by Dr. Jari Pigg.   I have personally reviewed the images performed during this visit and I agree with the Radiologist's read.   Interpretation by Radiologist:  Dg Chest 1 View  Result Date: 02/02/2019 CLINICAL DATA:  Chest pain and shortness of breath. COVID-19 positive EXAM: CHEST  1 VIEW COMPARISON:  Radiographs and CT 10/24/2018 FINDINGS: Low lung volumes. Normal heart size and mediastinal contours. Minimal vague heterogeneous opacities in the lung bases, right greater than left. No pulmonary edema, pleural fluid, or pneumothorax. Incidental note of gaseous gastric distention in the upper abdomen. IMPRESSION: Low lung volumes with vague bibasilar opacities, likely representing mild COVID-19 pneumonia. Electronically Signed   By: Keith Rake M.D.   On: 02/02/2019 22:24   Ct Abdomen Pelvis W Contrast  Result Date: 02/03/2019 CLINICAL DATA:  Chest and abdominal pain.  COVID-19 positive EXAM: CT ABDOMEN AND PELVIS WITH CONTRAST TECHNIQUE: Multidetector CT imaging of the abdomen and pelvis was performed using the standard protocol following bolus administration of intravenous contrast. CONTRAST:  164mL OMNIPAQUE IOHEXOL 300  MG/ML  SOLN COMPARISON:  CT abdomen pelvis 11/14/2018 FINDINGS: LOWER CHEST: There is no basilar pleural or apical pericardial effusion. HEPATOBILIARY: Diffuse hypoattenuation of the liver relative to the spleen suggests hepatic steatosis. No focal liver lesion or biliary dilatation. Status post cholecystectomy. PANCREAS: The pancreatic parenchymal contours are normal and there is no ductal dilatation. There is no peripancreatic fluid collection. SPLEEN: Normal. ADRENALS/URINARY TRACT: --Adrenal glands: Normal. --Right kidney/ureter: No hydronephrosis, nephroureterolithiasis, perinephric stranding or solid renal mass. --Left kidney/ureter: No hydronephrosis, nephroureterolithiasis, perinephric stranding or solid renal mass. --Urinary bladder: Normal for degree of distention STOMACH/BOWEL: --Stomach/Duodenum: There is no hiatal hernia or other gastric abnormality. The duodenal course and caliber are normal. --Small bowel: No dilatation or inflammation. --Colon: No focal abnormality. --Appendix: Normal. VASCULAR/LYMPHATIC: Normal course and caliber of the major abdominal vessels. No abdominal or pelvic lymphadenopathy. REPRODUCTIVE: Normal uterus and ovaries. MUSCULOSKELETAL. No bony spinal canal stenosis or focal osseous abnormality. OTHER: None. IMPRESSION: 1. No acute abnormality of the abdomen or pelvis. 2. Hepatic steatosis. Electronically Signed   By: Ulyses Jarred M.D.   On: 02/03/2019 00:46      Rudene Re, MD 02/03/19 [phone]

## 2019-02-03 NOTE — ED Notes (Signed)
Dr Alfred Levins told about pt still having 9/10 pain, EDP with pt  Pt calling sig other for ride

## 2019-02-03 NOTE — ED Notes (Signed)
Interpreter used for DC  Peripheral IV discontinued. Catheter intact. No signs of infiltration or redness. Gauze applied to IV site.   Peripheral IV discontinued. Catheter intact. No signs of infiltration or redness. Gauze applied to IV site.   Discharge instructions reviewed with patient. Questions fielded by this RN. Patient verbalizes understanding of instructions. Patient discharged home in stable condition per veronese. No acute distress noted at time of discharge.

## 2019-02-04 LAB — URINE CULTURE: Culture: <10000 — AB

## 2019-10-04 ENCOUNTER — Ambulatory Visit: Payer: Self-pay | Attending: Internal Medicine

## 2019-10-04 DIAGNOSIS — Z23 Encounter for immunization: Secondary | ICD-10-CM

## 2019-10-04 NOTE — Progress Notes (Signed)
   Covid-19 Vaccination Clinic  Name:  Terry [name]    MRN: [id number] DOB: Oct 02, 1970  10/04/2019  Ms. Terry [name] was observed post Covid-19 immunization for 15 minutes without incident. She was provided with Vaccine Information Sheet and instruction to access the V-Safe system.   Ms. Terry [name] was instructed to call 911 with any severe reactions post vaccine: Marland Kitchen Difficulty breathing  . Swelling of face and throat  . A fast heartbeat  . A bad rash all over body  . Dizziness and weakness   Immunizations Administered    Name Date Dose VIS Date Route   Pfizer COVID-19 Vaccine 10/04/2019  3:49 PM 0.3 mL 06/27/2019 Intramuscular   Manufacturer: [employer], [employer]   Lot: [id number]   NDC: [address]-5

## 2019-10-25 ENCOUNTER — Ambulatory Visit: Payer: Self-pay | Attending: Internal Medicine

## 2019-10-25 DIAGNOSIS — Z23 Encounter for immunization: Secondary | ICD-10-CM

## 2019-10-25 NOTE — Progress Notes (Signed)
   Covid-19 Vaccination Clinic  Name:  Diedre Maclellan    MRN: [id number] DOB: Jul 17, 1971  10/25/2019  Ms. Lagunes Ardyth Harps was observed post Covid-19 immunization for 15 minutes without incident. She was provided with Vaccine Information Sheet and instruction to access the V-Safe system.   Ms. Taneal Sonntag was instructed to call 911 with any severe reactions post vaccine: Marland Kitchen Difficulty breathing  . Swelling of face and throat  . A fast heartbeat  . A bad rash all over body  . Dizziness and weakness   Immunizations Administered    Name Date Dose VIS Date Route   Pfizer COVID-19 Vaccine 10/25/2019  3:35 AM 0.3 mL 06/27/2019 Intramuscular   Manufacturer: [employer], [employer]   Lot: [id number]   NDC: [address]-8

## 2021-12-10 ENCOUNTER — Other Ambulatory Visit: Payer: Self-pay

## 2021-12-10 ENCOUNTER — Encounter: Payer: Self-pay | Admitting: Emergency Medicine

## 2021-12-10 ENCOUNTER — Emergency Department: Payer: Self-pay

## 2021-12-10 ENCOUNTER — Emergency Department
Admission: EM | Admit: 2021-12-10 | Discharge: 2021-12-11 | Disposition: A | Discharge status: Home / Self Care | Payer: Self-pay | Attending: Emergency Medicine | Admitting: Emergency Medicine

## 2021-12-10 DIAGNOSIS — Z79899 Other long term (current) drug therapy: Secondary | ICD-10-CM | POA: Insufficient documentation

## 2021-12-10 DIAGNOSIS — I10 Essential (primary) hypertension: Secondary | ICD-10-CM | POA: Insufficient documentation

## 2021-12-10 DIAGNOSIS — R112 Nausea with vomiting, unspecified: Secondary | ICD-10-CM | POA: Insufficient documentation

## 2021-12-10 DIAGNOSIS — R079 Chest pain, unspecified: Secondary | ICD-10-CM | POA: Insufficient documentation

## 2021-12-10 DIAGNOSIS — R0602 Shortness of breath: Secondary | ICD-10-CM | POA: Insufficient documentation

## 2021-12-10 DIAGNOSIS — F10920 Alcohol use, unspecified with intoxication, uncomplicated: Secondary | ICD-10-CM

## 2021-12-10 DIAGNOSIS — R4182 Altered mental status, unspecified: Secondary | ICD-10-CM | POA: Insufficient documentation

## 2021-12-10 DIAGNOSIS — R000 Tachycardia, unspecified: Secondary | ICD-10-CM | POA: Insufficient documentation

## 2021-12-10 DIAGNOSIS — F10129 Alcohol abuse with intoxication, unspecified: Secondary | ICD-10-CM | POA: Insufficient documentation

## 2021-12-10 DIAGNOSIS — Y907 Blood alcohol level of 200-239 mg/100 ml: Secondary | ICD-10-CM | POA: Insufficient documentation

## 2021-12-10 LAB — CBC
HCT: 44.5 % (ref 36.0–46.0)
Hemoglobin: 14.8 g/dL (ref 12.0–15.0)
MCH: 31.5 pg (ref 26.0–34.0)
MCHC: 33.3 g/dL (ref 30.0–36.0)
MCV: 94.7 fL (ref 80.0–100.0)
Platelets: 294 10*3/uL (ref 150–400)
RBC: 4.7 MIL/uL (ref 3.87–5.11)
RDW: 13.2 % (ref 11.5–15.5)
WBC: 8.2 10*3/uL (ref 4.0–10.5)
nRBC: 0 % (ref 0.0–0.2)

## 2021-12-10 MED ORDER — ONDANSETRON HCL 4 MG/2ML IJ SOLN
4.0000 mg | Freq: Once | INTRAMUSCULAR | Status: AC
  Administered 2021-12-10: 4 mg via INTRAVENOUS
  Filled 2021-12-10: qty 2

## 2021-12-10 MED ORDER — DROPERIDOL 2.5 MG/ML IJ SOLN
1.2500 mg | Freq: Once | INTRAMUSCULAR | Status: AC
  Administered 2021-12-11: 1.25 mg via INTRAVENOUS
  Filled 2021-12-10: qty 2

## 2021-12-10 MED ORDER — AMMONIA AROMATIC IN INHA
RESPIRATORY_TRACT | Status: AC
  Filled 2021-12-10: qty 10

## 2021-12-10 MED ORDER — SODIUM CHLORIDE 0.9 % IV BOLUS
1000.0000 mL | Freq: Once | INTRAVENOUS | Status: AC
  Administered 2021-12-10: 1000 mL via INTRAVENOUS

## 2021-12-10 NOTE — ED Triage Notes (Signed)
Pt arrives via [facility].  Niece at bedsides states that pt last ate at 1800 and has since been drinking tequila shots and margaritas. Pt began "trying to pass out."  Niece was driving pt to hospital and noticed pt having trouble breathing and dry heaving and called EMS to meet.  Pt alert upon arrival to ED but passed out in bed, ammonia had to be used to arouse. Pt actively vomiting at this time, alert to voice and able to follow commands.

## 2021-12-10 NOTE — ED Provider Notes (Signed)
[facility] Provider Note    Event Date/Time   First MD Initiated Contact with Patient 12/10/21 2331     (approximate)   History   Alcohol Intoxication   HPI  Level V caveat: Limited by intoxication and distress History obtained via patient's niece  Terry [name] is a 51 y.o. [name] brought to the ED via EMS from home with a chief complaint of alcohol intoxication, chest pain and shortness of breath.  Niece states patient was drinking Artist at a party and family noted she had had too much to drink.  Patient became histrionic, complained of chest pain and shortness of breath so EMS was called.  Niece denies patient falling or striking her head.  Patient arrives to the ED actively vomiting and histrionic.  Rest of history is unobtainable secondary to patient's level of intoxication and distress.  Niece denies illicit drug use.     Past Medical History   Past Medical History:  Diagnosis Date   Hypertension      Active Problem List  There are no problems to display for this patient.    Past Surgical History  History reviewed. No pertinent surgical history.   Home Medications   Prior to Admission medications   Medication Sig Start Date End Date Taking? Authorizing Provider  ondansetron (ZOFRAN-ODT) 4 MG disintegrating tablet Take 1 tablet (4 mg total) by mouth every 8 (eight) hours as needed for nausea or vomiting. 12/11/21  Yes Irean Hong, MD  acetaminophen (TYLENOL) 325 MG tablet Take 650 mg by mouth every 6 (six) hours as needed for pain. 09/26/18   [provider]  amitriptyline (ELAVIL) 25 MG tablet Take 25 mg by mouth at bedtime. 01/03/18   [provider]  amLODipine (NORVASC) 10 MG tablet Take 10 mg by mouth daily.    [provider]  chlorthalidone (HYGROTON) 50 MG tablet Take 50 mg by mouth daily.    [provider]  DULoxetine (CYMBALTA) 30 MG capsule Take 60 mg by mouth daily. 03/25/18  03/25/19  [provider]  glipiZIDE (GLUCOTROL) 5 MG tablet Take 5 mg by mouth daily. 11/11/18 11/11/19  [provider]  HYDROcodone-acetaminophen (NORCO/VICODIN) 5-325 MG tablet Take 1 tablet by mouth every 4 (four) hours as needed. 10/24/18   Minna Antis, MD  lisinopril (ZESTRIL) 40 MG tablet Take 40 mg by mouth daily.    [provider]  loratadine (CLARITIN) 10 MG tablet Take 10 mg by mouth daily.    [provider]  metoprolol succinate (TOPROL-XL) 25 MG 24 hr tablet Take 25 mg by mouth daily.    [provider]  promethazine (PHENERGAN) 12.5 MG tablet Take 12.5 mg by mouth every 6 (six) hours as needed for nausea. 11/11/18   [provider]     Allergies  Hydrocodone-acetaminophen   Family History  History reviewed. No pertinent family history.   Physical Exam  Triage Vital Signs: ED Triage Vitals  Enc Vitals Group     BP      Pulse      Resp      Temp      Temp src      SpO2      Weight      Height      Head Circumference      Peak Flow      Pain Score      Pain Loc      Pain Edu?  Excl. in GC?     Updated Vital Signs: BP (!) 133/99   Pulse 99   Temp 97.7 F (36.5 C) (Axillary)   Resp 17   Ht 5\' 3"  (1.6 m)   Wt 63.5 kg   SpO2 100%   BMI 24.80 kg/m    General: Awake, moderate distress.  CV:  Tachycardic.  Good peripheral perfusion.  Resp:  Increased effort.  Hyperventilating. Abd:  Actively vomiting.  No distention.  Other:  Intoxicated.  Tearful. MAEx4.   ED Results / Procedures / Treatments  Labs (all labs ordered are listed, but only abnormal results are displayed) Labs Reviewed  COMPREHENSIVE METABOLIC PANEL - Abnormal; Notable for the following components:      Result Value   CO2 21 (*)    Glucose, Bld 299 (*)    Calcium 8.6 (*)    Total Protein 8.5 (*)    AST 60 (*)    ALT 56 (*)    Alkaline Phosphatase 134 (*)    All other components within normal limits  ETHANOL -  Abnormal; Notable for the following components:   Alcohol, Ethyl (B) 205 (*)    All other components within normal limits  CBC  LIPASE, BLOOD  TROPONIN I (HIGH SENSITIVITY)  TROPONIN I (HIGH SENSITIVITY)     EKG  ED ECG REPORT I, Zaquan Duffner J, the attending physician, personally viewed and interpreted this ECG.   Date: 12/10/2021  EKG Time: 2338  Rate: 111  Rhythm: sinus tachycardia  Axis: Normal  Intervals:none  ST&T Change: Nonspecific    RADIOLOGY I have independently visualized and interpreted patient's CT head and chest x-ray as well as noted the radiology interpretation:  CT head: No ICH  Chest x-ray: No acute cardiopulmonary process  Official radiology report(s): CT Head Wo Contrast  Result Date: 12/11/2021 CLINICAL DATA:  Altered mental status EXAM: CT HEAD WITHOUT CONTRAST TECHNIQUE: Contiguous axial images were obtained from the base of the skull through the vertex without intravenous contrast. RADIATION DOSE REDUCTION: This exam was performed according to the departmental dose-optimization program which includes automated exposure control, adjustment of the mA and/or kV according to patient size and/or use of iterative reconstruction technique. COMPARISON:  None Available. FINDINGS: Brain: No evidence of acute infarction, hemorrhage, hydrocephalus, extra-axial collection or mass lesion/mass effect. Vascular: No hyperdense vessel or unexpected calcification. Skull: Normal. Negative for fracture or focal lesion. Sinuses/Orbits: The visualized paranasal sinuses are essentially clear. The mastoid air cells are unopacified. Other: None. IMPRESSION: Normal head CT. Electronically Signed   By: 12/13/2021 M.D.   On: 12/11/2021 00:56   DG Chest Port 1 View  Result Date: 12/11/2021 CLINICAL DATA:  ETOH, vomiting EXAM: PORTABLE CHEST 1 VIEW COMPARISON:  02/02/2019 FINDINGS: Heart and mediastinal contours are within normal limits. No focal opacities or effusions. No acute  bony abnormality. IMPRESSION: No active disease. Electronically Signed   By: 02/04/2019 M.D.   On: 12/11/2021 00:12     PROCEDURES:  Critical Care performed: Yes, see critical care procedure note(s)  CRITICAL CARE Performed by: 12/13/2021   Total critical care time: 45 minutes  Critical care time was exclusive of separately billable procedures and treating other patients.  Critical care was necessary to treat or prevent imminent or life-threatening deterioration.  Critical care was time spent personally by me on the following activities: development of treatment plan with patient and/or surrogate as well as nursing, discussions with consultants, evaluation of patient's response to treatment, examination of patient, obtaining  history from patient or surrogate, ordering and performing treatments and interventions, ordering and review of laboratory studies, ordering and review of radiographic studies, pulse oximetry and re-evaluation of patient's condition.   Marland Kitchen1-3 Lead EKG Interpretation Performed by: Irean Hong, MD Authorized by: Irean Hong, MD     Interpretation: abnormal     ECG rate:  110   ECG rate assessment: tachycardic     Rhythm: sinus tachycardia     Ectopy: none     Conduction: normal   Comments:     Patient placed on cardiac monitor to evaluate for arrhythmias   MEDICATIONS ORDERED IN ED: Medications  sodium chloride 0.9 % bolus 1,000 mL (0 mLs Intravenous Stopped 12/11/21 0328)  ondansetron (ZOFRAN) injection 4 mg (4 mg Intravenous Given 12/10/21 2350)  ammonia inhalant (  Given 12/10/21 2345)  droperidol (INAPSINE) 2.5 MG/ML injection 1.25 mg (1.25 mg Intravenous Given 12/11/21 0020)  sodium chloride 0.9 % bolus 1,000 mL (0 mLs Intravenous Stopped 12/11/21 0328)  acetaminophen (TYLENOL) tablet 1,000 mg (1,000 mg Oral Given 12/11/21 9163)     IMPRESSION / MDM / ASSESSMENT AND PLAN / ED COURSE  I reviewed the triage vital signs and the nursing notes.                              51 year old [name] presenting with acute alcohol intoxication with complaints of chest pain and shortness of breath; actively vomiting.  I have identified this patient to potentially have a life-threatening condition. Differential diagnosis includes, but is not limited to, alcohol, illicit or prescription medications, or other toxic ingestion; intracranial pathology such as stroke or intracerebral hemorrhage; fever or infectious causes including sepsis; hypoxemia and/or hypercarbia; uremia; trauma; endocrine related disorders such as diabetes, hypoglycemia, and thyroid-related diseases; hypertensive encephalopathy; etc.   I have personally reviewed patient's records and see that patient had a endocrinology visit for diabetes follow-up on 12/02/2021.  The patient is on the cardiac monitor to evaluate for evidence of arrhythmia and/or significant heart rate changes.  We will obtain lab work, initiate IV fluid resuscitation, IV Zofran for emesis and observe in the ED until sobriety.  Clinical Course as of 12/11/21 8466  Sat Dec 10, 2021  2336 Patient passed out, minimal response with sternal rub.  Will obtain CT head to evaluate for intracranial hemorrhage.  Used ammonia salts with good relief, patient now moaning and moving about. [JS]  2359 Continues to dry heave.  Will trial IV droperidol. [JS]  Sun Dec 11, 2021  0136 Patient sleeping soundly.  Family at bedside.  Updated them of all laboratory and imaging results.  Laboratory results demonstrate normal WBC, hyperglycemia without elevation of anion gap, mildly elevated transaminases likely from alcohol; elevated EtOH, initial troponin negative.  CT head and chest x-ray unremarkable.  Will recheck troponin, administer additional IV fluids and observe in the ED until sobriety. [JS]  0234 Repeat troponin unremarkable. [JS]  0434 Patient remained sleeping in no acute distress.  Family remains at bedside.  We will continue to monitor.  [JS]  0602 Patient awake, complaining of headache.  Will administer Tylenol and oral fluids and reassess. [JS]  [id number] No further assist.  Will discharge home with family members with prescription for as needed Zofran.  Strict return precautions given.  All verbalized understanding and agree with plan of care. [JS]    Clinical Course User Index [JS] Irean Hong, MD  FINAL CLINICAL IMPRESSION(S) / ED DIAGNOSES   Final diagnoses:  Alcoholic intoxication without complication (HCC)  Nausea and vomiting, unspecified vomiting type     Rx / DC Orders   ED Discharge Orders          Ordered    ondansetron (ZOFRAN-ODT) 4 MG disintegrating tablet  Every 8 hours PRN        12/11/21 0235             Note:  This document was prepared using Dragon voice recognition software and may include unintentional dictation errors.   Irean HongSung, Lujain Kraszewski J, MD 12/11/21 [phone]

## 2021-12-11 ENCOUNTER — Encounter: Payer: Self-pay | Admitting: Emergency Medicine

## 2021-12-11 ENCOUNTER — Emergency Department: Payer: Self-pay

## 2021-12-11 LAB — TROPONIN I (HIGH SENSITIVITY)
Troponin I (High Sensitivity): 15 ng/L (ref <18)
Troponin I (High Sensitivity): 17 ng/L (ref <18)

## 2021-12-11 LAB — COMPREHENSIVE METABOLIC PANEL
ALT: 56 U/L — ABNORMAL HIGH (ref 0–44)
AST: 60 U/L — ABNORMAL HIGH (ref 15–41)
Albumin: 4.5 g/dL (ref 3.5–5.0)
Alkaline Phosphatase: 134 U/L — ABNORMAL HIGH (ref 38–126)
Anion gap: 9 (ref 5–15)
BUN: 11 mg/dL (ref 6–20)
CO2: 21 mmol/L — ABNORMAL LOW (ref 22–32)
Calcium: 8.6 mg/dL — ABNORMAL LOW (ref 8.9–10.3)
Chloride: 111 mmol/L (ref 98–111)
Creatinine, Ser: 0.57 mg/dL (ref 0.44–1.00)
GFR, Estimated: >60 mL/min (ref >60)
Glucose, Bld: 299 mg/dL — ABNORMAL HIGH (ref 70–99)
Potassium: 3.5 mmol/L (ref 3.5–5.1)
Sodium: 141 mmol/L (ref 135–145)
Total Bilirubin: 0.6 mg/dL (ref 0.3–1.2)
Total Protein: 8.5 g/dL — ABNORMAL HIGH (ref 6.5–8.1)

## 2021-12-11 LAB — ETHANOL: Alcohol, Ethyl (B): 205 mg/dL — ABNORMAL HIGH (ref <10)

## 2021-12-11 LAB — LIPASE, BLOOD: Lipase: 35 U/L (ref 11–51)

## 2021-12-11 MED ORDER — ONDANSETRON 4 MG PO TBDP: 4.0000 mg | ORAL_TABLET | Freq: Three times a day (TID) | ORAL | 0 refills | Status: AC | PRN

## 2021-12-11 MED ORDER — ACETAMINOPHEN 500 MG PO TABS
1000.0000 mg | ORAL_TABLET | Freq: Once | ORAL | Status: AC
  Administered 2021-12-11: 1000 mg via ORAL
  Filled 2021-12-11: qty 2

## 2021-12-11 MED ORDER — SODIUM CHLORIDE 0.9 % IV BOLUS
1000.0000 mL | Freq: Once | INTRAVENOUS | Status: AC
  Administered 2021-12-11: 1000 mL via INTRAVENOUS

## 2021-12-11 NOTE — ED Notes (Signed)
Pt taken to CT at this time.

## 2021-12-11 NOTE — ED Notes (Signed)
Per family in room, pt has woken twice and asked when she could go home and told them she has a H/A.  When this RN asked pt if she had a H/A she opened her eyes and was able to give me a pain level of 9.  EDP Sung notified.

## 2021-12-11 NOTE — Discharge Instructions (Signed)
You may take Zofran as needed for nausea.  Drink alcohol only in moderation.  Return to the ER for worsening symptoms, persistent vomiting, difficulty breathing or other concerns.

## 2022-02-23 ENCOUNTER — Emergency Department: Payer: Self-pay

## 2022-02-23 ENCOUNTER — Other Ambulatory Visit: Payer: Self-pay

## 2022-02-23 ENCOUNTER — Emergency Department
Admission: EM | Admit: 2022-02-23 | Discharge: 2022-02-23 | Disposition: A | Discharge status: Home / Self Care | Payer: Self-pay | Attending: Emergency Medicine | Admitting: Emergency Medicine

## 2022-02-23 DIAGNOSIS — X501XXA Overexertion from prolonged static or awkward postures, initial encounter: Secondary | ICD-10-CM | POA: Diagnosis not present

## 2022-02-23 DIAGNOSIS — I10 Essential (primary) hypertension: Secondary | ICD-10-CM | POA: Diagnosis not present

## 2022-02-23 DIAGNOSIS — E119 Type 2 diabetes mellitus without complications: Secondary | ICD-10-CM | POA: Diagnosis not present

## 2022-02-23 DIAGNOSIS — M25511 Pain in right shoulder: Secondary | ICD-10-CM

## 2022-02-23 DIAGNOSIS — Y990 Civilian activity done for income or pay: Secondary | ICD-10-CM | POA: Insufficient documentation

## 2022-02-23 MED ORDER — OXYCODONE-ACETAMINOPHEN 5-325 MG PO TABS
1.0000 | ORAL_TABLET | Freq: Once | ORAL | Status: AC
  Administered 2022-02-23: 1 via ORAL
  Filled 2022-02-23: qty 1

## 2022-02-23 MED ORDER — OXYCODONE-ACETAMINOPHEN 5-325 MG PO TABS: 1.0000 | ORAL_TABLET | Freq: Four times a day (QID) | ORAL | 0 refills | Status: AC | PRN

## 2022-02-23 NOTE — ED Notes (Signed)
See triage note   Presents with pain to right shoulder   states she was using a broom to get some dirt out form under something  the broom caught   She developed pain after trying to pull broom back  Having increased pain with movement  No deformity noted  Good pulses

## 2022-02-23 NOTE — Discharge Instructions (Signed)
Follow up with Dr. Signa Kell if your shoulder is not improving. Ice Oxycodone for pain every 6 hours only if needed.  Do not drive while taking this medication.

## 2022-02-23 NOTE — ED Provider Notes (Signed)
[facility] Provider Note    None    (approximate)   History   Shoulder Injury   HPI Spanish interpreter [facility] and  [employer] video has been used.  Terry [name] is a 51 y.o. female   presents to the ED after an injury to her right shoulder that occurred at 9 AM today at [employer].  Patient states that at approximately 9 AM today she was sweeping the floor and had pain to her right shoulder.  She reports that now she has decreased range of motion and pain.  Patient has a history of rotator cuff surgery on the same shoulder.  Patient has history of hypertension and type 2 diabetes.      Physical Exam   Triage Vital Signs: ED Triage Vitals  Enc Vitals Group     BP 02/23/22 1007 (!) 147/107     Pulse Rate 02/23/22 1007 70     Resp 02/23/22 1007 16     Temp 02/23/22 1007 98.4 F (36.9 C)     Temp Source 02/23/22 1007 Oral     SpO2 02/23/22 1007 95 %     Weight 02/23/22 1014 149 lb (67.6 kg)     Height 02/23/22 1041 5\' 3"  (1.6 m)     Head Circumference --      Peak Flow --      Pain Score 02/23/22 1013 10     Pain Loc --      Pain Edu? --      Excl. in GC? --     Most recent vital signs: Vitals:   02/23/22 1007  BP: (!) 147/107  Pulse: 70  Resp: 16  Temp: 98.4 F (36.9 C)  SpO2: 95%     General: Awake, no distress.  CV:  Good peripheral perfusion.  Heart rate rate and rhythm. Resp:  Normal effort.  Clear bilaterally. Abd:  No distention.  Other:  On examination of the right shoulder there is no gross deformity however range of motion is restricted secondary to increased pain.  Patient is able to abduct to approximately 45 degrees.  No crepitus was noted.  No discoloration of the muscle.  Motor or sensory function intact distally.   ED Results / Procedures / Treatments   Labs (all labs ordered are listed, but only abnormal results are displayed) Labs Reviewed - No data to display    RADIOLOGY  X-ray right  shoulder is negative for acute bony injury reviewed and interpreted by myself independent of the radiologist.   PROCEDURES:  Critical Care performed:   Procedures   MEDICATIONS ORDERED IN ED: Medications  oxyCODONE-acetaminophen (PERCOCET/ROXICET) 5-325 MG per tablet 1 tablet (1 tablet Oral Given 02/23/22 1305)     IMPRESSION / MDM / ASSESSMENT AND PLAN / ED COURSE  I reviewed the triage vital signs and the nursing notes.   Differential diagnosis includes, but is not limited to, right shoulder pain, right shoulder strain, contusion right shoulder, reinjury of rotator cuff right shoulder.  51 year old female presents to the ED after an injury occurred while at work today.  Patient states that she was sweeping and that the broom hit something on the floor causing her arm to jerk and resulting in pain.  X-rays were reassuring that there was no acute bony injury.  Patient was instructed to use ice to her shoulder as needed for discomfort and to follow-up with her company's doctor or  Dr. 40 who is on-call for orthopedics.  Patient was given a prescription for oxycodone with 1 dose being given to her while in the ED.  Patient is aware that she cannot drive or operate machinery while taking this medication and a note was written for her to take to work.  Note is restricting her use of her right arm and if not improving that she should be followed up by an orthopedist before returning to full duty.      Patient's presentation is most consistent with acute complicated illness / injury requiring diagnostic workup.  FINAL CLINICAL IMPRESSION(S) / ED DIAGNOSES   Final diagnoses:  Acute pain of right shoulder     Rx / DC Orders   ED Discharge Orders          Ordered    oxyCODONE-acetaminophen (PERCOCET) 5-325 MG tablet  Every 6 hours PRN        02/23/22 1257             Note:  This document was prepared using Dragon voice recognition software and may include unintentional  dictation errors.   Tommi Rumps, PA-C 02/23/22 1529    Arnaldo Natal, MD 02/23/22 [phone]

## 2022-02-23 NOTE — ED Notes (Signed)
Drug screen and alcohol screen required Per Casimiro Needle- supervisor for pt.

## 2022-02-23 NOTE — ED Notes (Addendum)
Pt usnure if WC claim, number provided by patient   Terry [name] Who provided number below.  Kathlene November- [phone]- did not answer in regards to [facility]

## 2022-02-23 NOTE — ED Triage Notes (Addendum)
Triage completed with video interpretor- pt stating injury happened at work at [employer]- accident, three years ago pt had surgery for shoulder. This morning pt was sweeping the floor and developed a pain and they cannot move it.   Pt previous surgery was for rotator cuff. Pt denies any numbness and tingling just pain and inability to move.   Pt stated they took ibuprofen this morning at 8am for headache.

## 2022-03-15 ENCOUNTER — Other Ambulatory Visit: Payer: Self-pay | Admitting: Orthopedic Surgery

## 2022-03-15 DIAGNOSIS — M25511 Pain in right shoulder: Secondary | ICD-10-CM
